# Patient Record
Sex: Male | Born: 1945 | Race: White | Hispanic: No | Marital: Married | Smoking: Never smoker
Health system: Southern US, Community
[De-identification: ages and names within clinical notes are randomized; demographics above are authoritative.]

## PROBLEM LIST (undated history)

## (undated) DIAGNOSIS — I10 Essential (primary) hypertension: Secondary | ICD-10-CM

## (undated) DIAGNOSIS — E349 Endocrine disorder, unspecified: Secondary | ICD-10-CM

## (undated) DIAGNOSIS — E119 Type 2 diabetes mellitus without complications: Secondary | ICD-10-CM

## (undated) DIAGNOSIS — K219 Gastro-esophageal reflux disease without esophagitis: Secondary | ICD-10-CM

## (undated) DIAGNOSIS — J45909 Unspecified asthma, uncomplicated: Secondary | ICD-10-CM

## (undated) DIAGNOSIS — D126 Benign neoplasm of colon, unspecified: Secondary | ICD-10-CM

## (undated) DIAGNOSIS — G4733 Obstructive sleep apnea (adult) (pediatric): Secondary | ICD-10-CM

## (undated) DIAGNOSIS — K2270 Barrett's esophagus without dysplasia: Secondary | ICD-10-CM

## (undated) DIAGNOSIS — E785 Hyperlipidemia, unspecified: Secondary | ICD-10-CM

## (undated) DIAGNOSIS — J302 Other seasonal allergic rhinitis: Secondary | ICD-10-CM

## (undated) HISTORY — PX: COLONOSCOPY: SHX174

## (undated) HISTORY — PX: CATARACT EXTRACTION: SUR2

## (undated) HISTORY — PX: COLONOSCOPY WITH ESOPHAGOGASTRODUODENOSCOPY (EGD): SHX5779

## (undated) HISTORY — DX: Gastro-esophageal reflux disease without esophagitis: K21.9

## (undated) HISTORY — PX: YAG LASER APPLICATION: SHX6189

---

## 2000-03-13 HISTORY — PX: SEPTOPLASTY: SUR1290

## 2000-03-13 HISTORY — PX: FLEXIBLE SIGMOIDOSCOPY: SHX1649

## 2001-07-01 HISTORY — PX: CATARACT EXTRACTION, BILATERAL: SHX1313

## 2004-07-01 ENCOUNTER — Ambulatory Visit: Payer: Self-pay

## 2006-08-08 ENCOUNTER — Ambulatory Visit: Payer: Self-pay

## 2006-12-31 ENCOUNTER — Emergency Department: Payer: Self-pay | Admitting: Emergency Medicine

## 2007-08-20 ENCOUNTER — Ambulatory Visit: Payer: Self-pay | Admitting: Unknown Physician Specialty

## 2008-01-10 IMAGING — CR DG CHEST 2V
1 series · 2 of 2 positions shown · non-contrast
Comparison: none

REASON FOR EXAM: Chest shortness of breath,, cough,  pneumonia
COMMENTS:

[Series 1: view not recorded · 0.17mm/px · 2 of 2 slices shown]
[im 1/2]
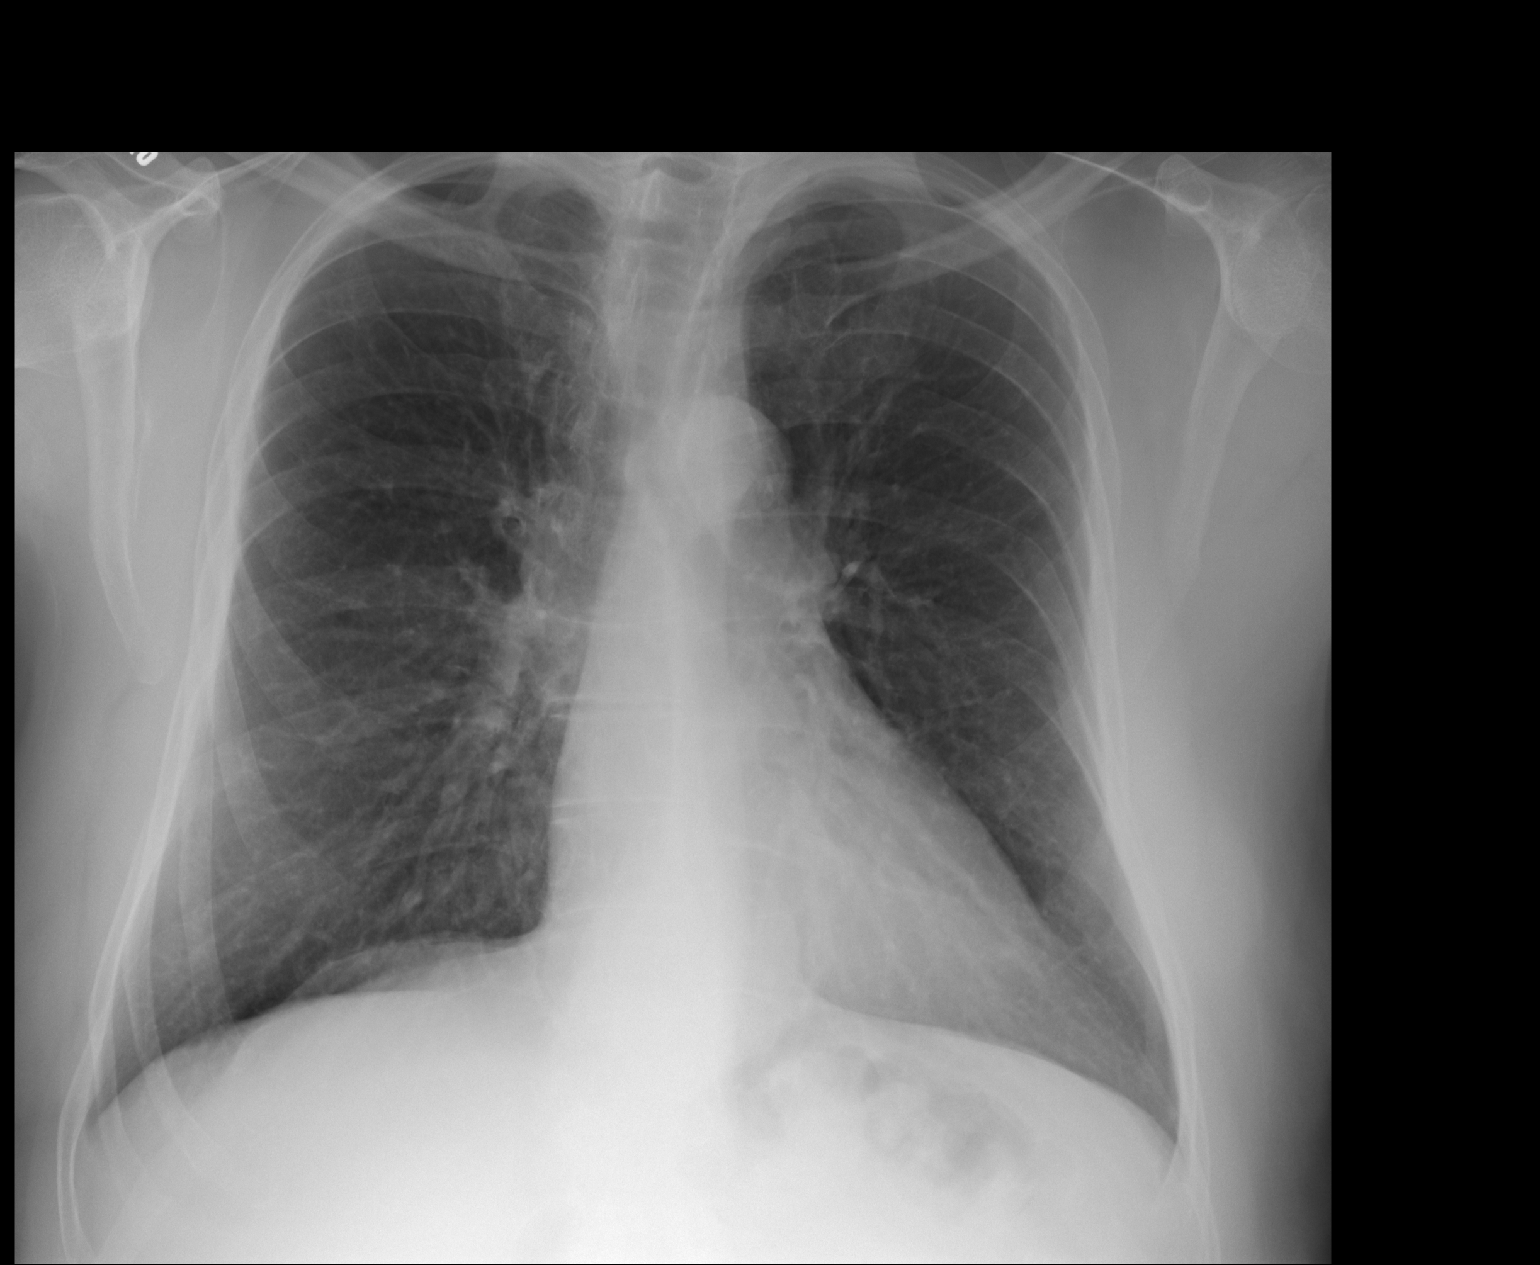
[im 2/2]
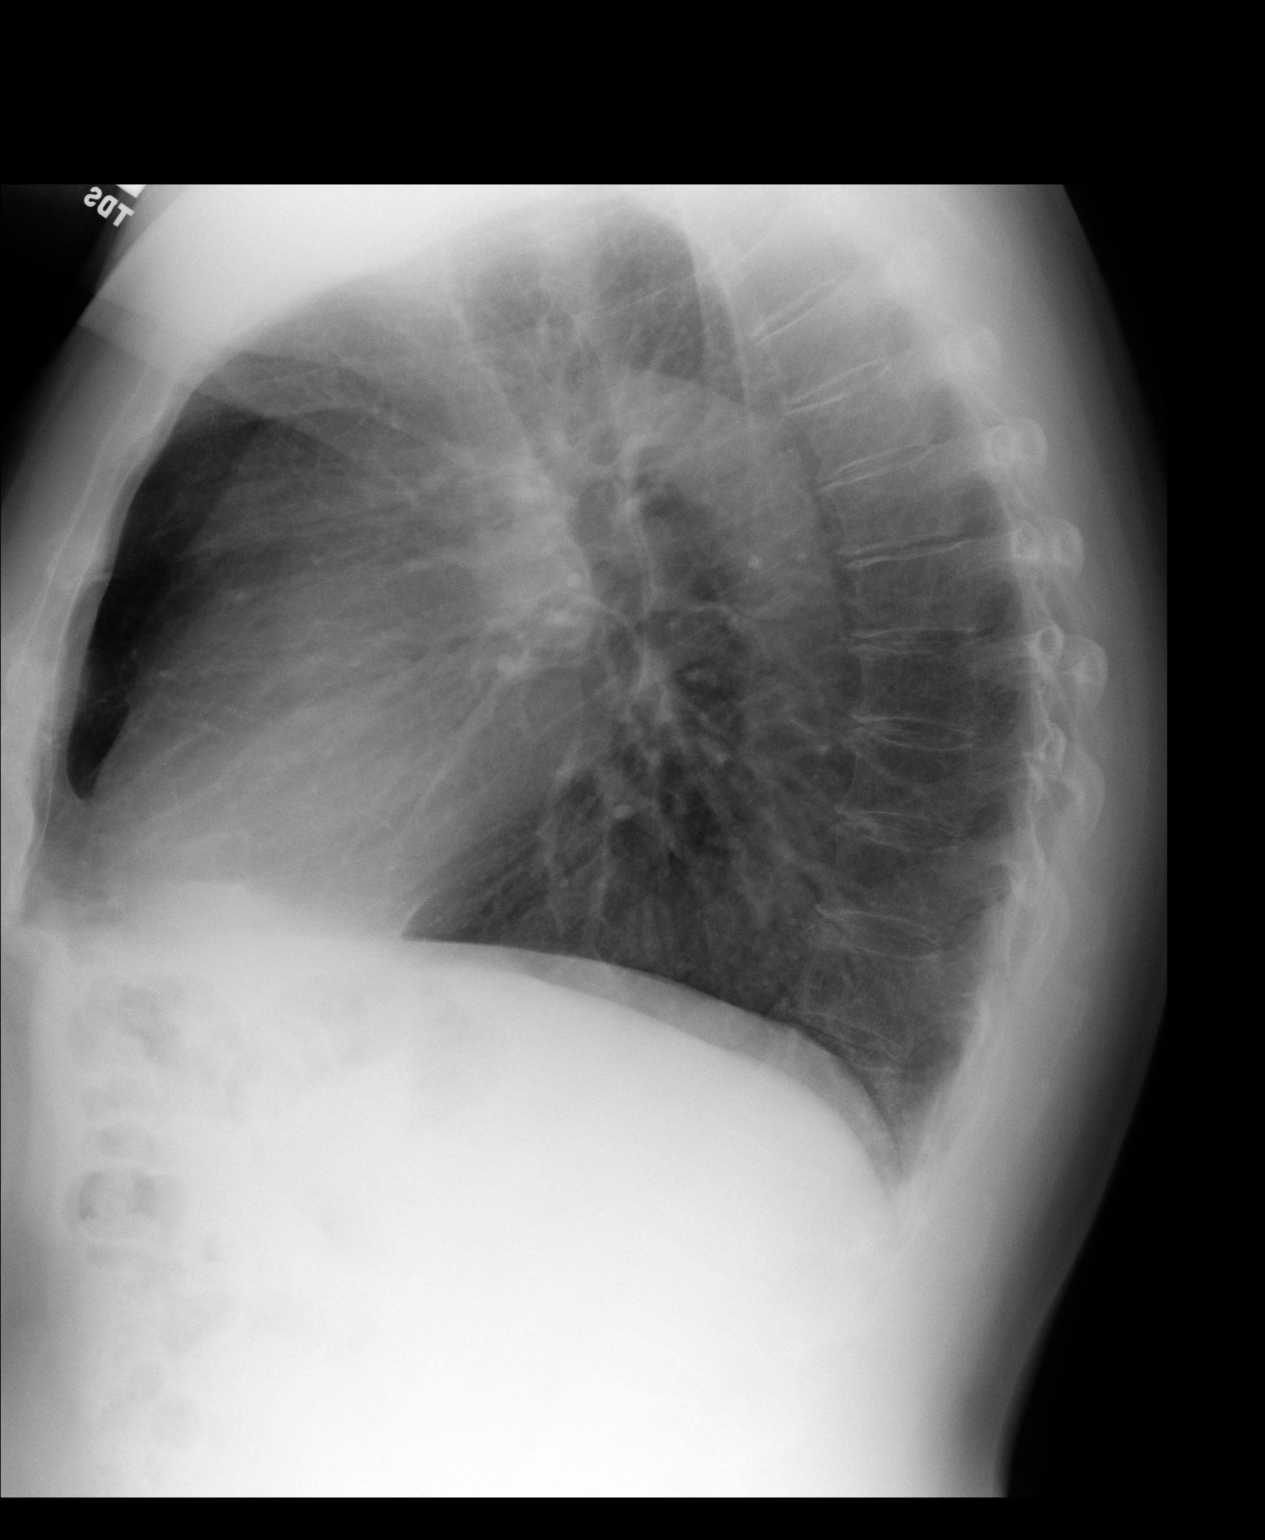

[2 of 2 positions shown; findings below may reference images not displayed]

PROCEDURE:     DXR - DXR CHEST PA (OR AP) AND LATERAL  - August 08, 2006  [DATE]

RESULT:     The lung fields are clear.  No pneumonia, pneumothorax, or
pleural effusion is seen.  Chest appears hyperexpanded, suspicious for
history of COPD or asthma.  There is a slight lumbar scoliosis with
convexity to the RIGHT.
IMPRESSION: The lung fields are clear.

The chest appears mildly hyperexpanded.

## 2008-06-03 IMAGING — CT CT CHEST W/ CM
2 series · 16 of 32 positions shown, 20 images · non-contrast
Comparison: none

REASON FOR EXAM: Risk factors for PE just returned from [place], Fever,
Chest Pain
COMMENTS:

[Series 4: soft tissue · axial · 0.74mm/px · z∈[-632,-584]mm · 2 of 107 slices shown]
[im 9/107  mediastinal]
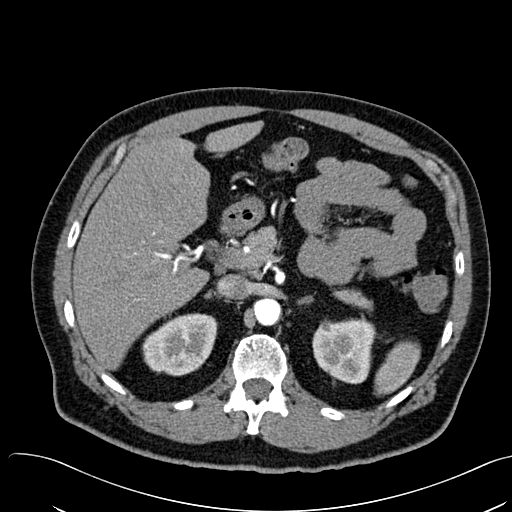
[im 25/107  mediastinal]
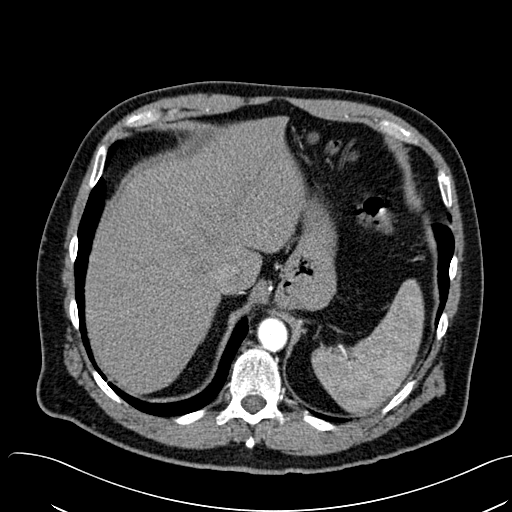

[Series 5: lung windows · axial · 0.74mm/px · z∈[-629,-365]mm · 14 of 106 slices shown, 18 images]
[im 9/106  mediastinal]
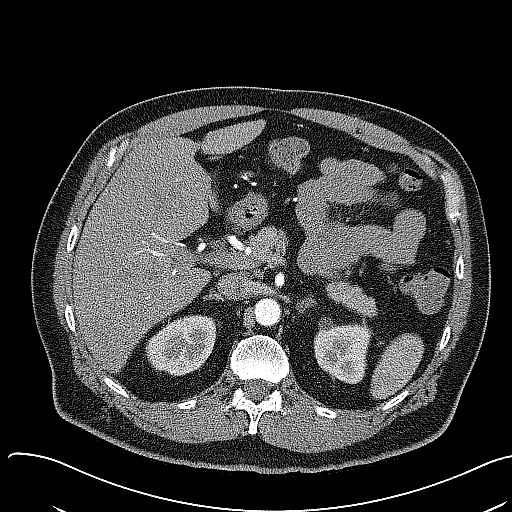
[im 9/106  lung]
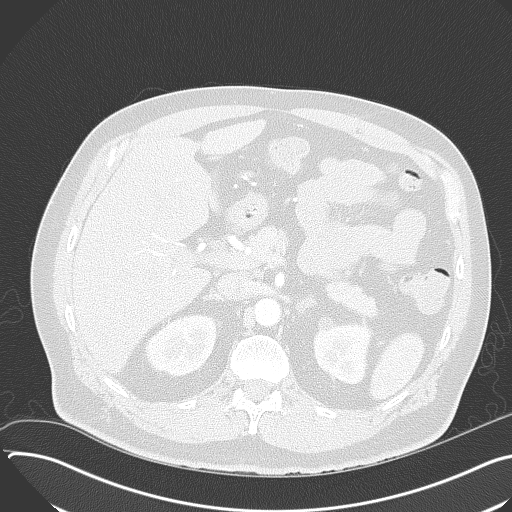
[im 17/106  lung]
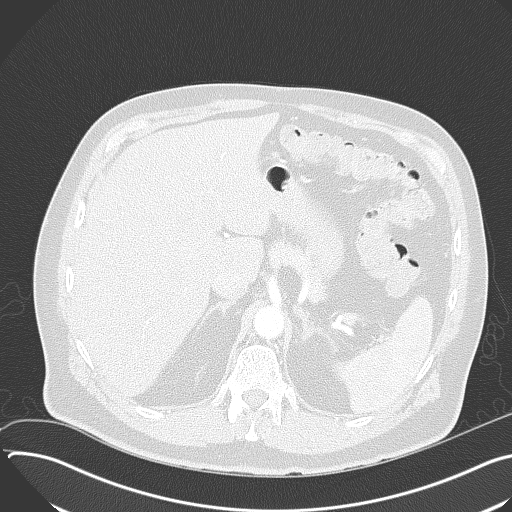
[im 25/106  lung]
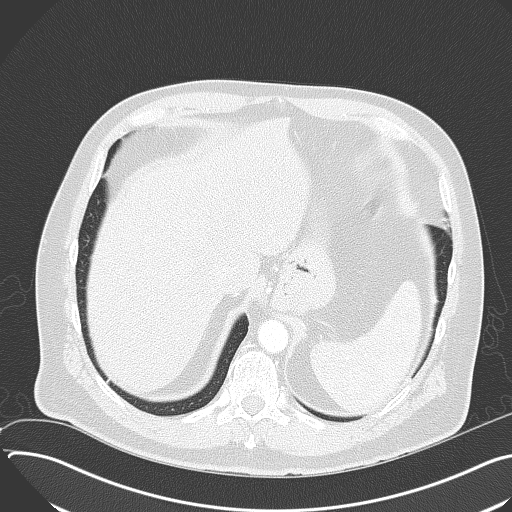
[im 33/106  lung]
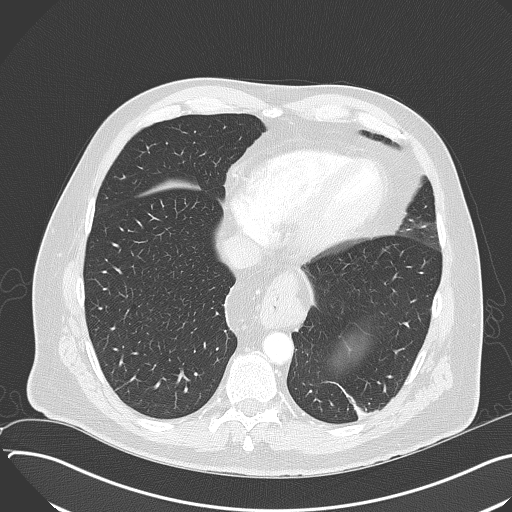
[im 41/106  mediastinal]
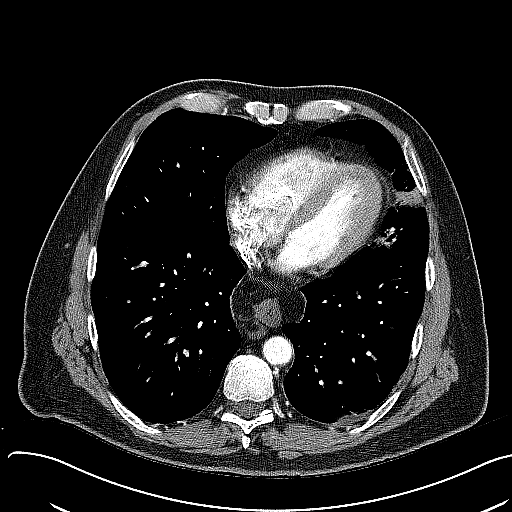
[im 41/106  lung]
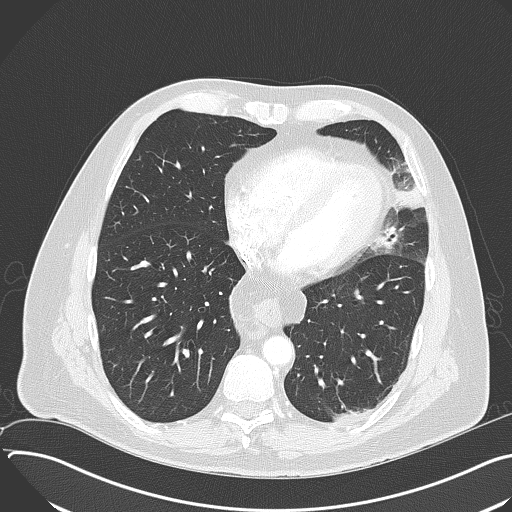
[im 49/106  lung]
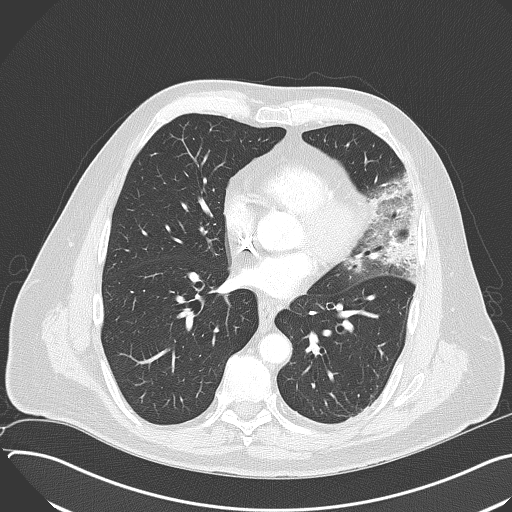
[im 50/106  lung]
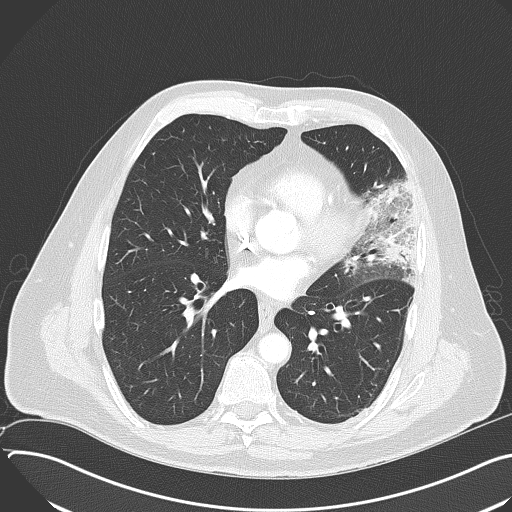
[im 53/106  lung]
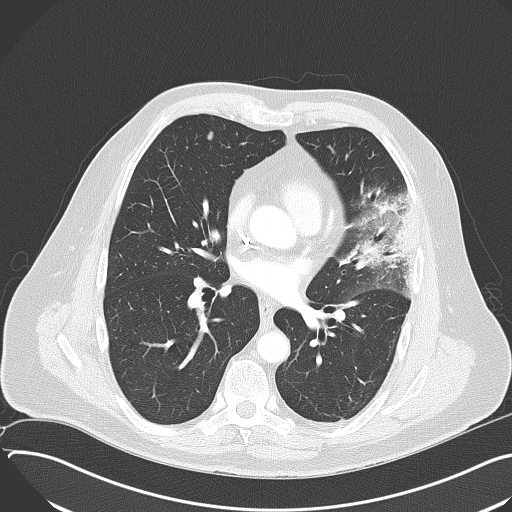
[im 57/106  mediastinal]
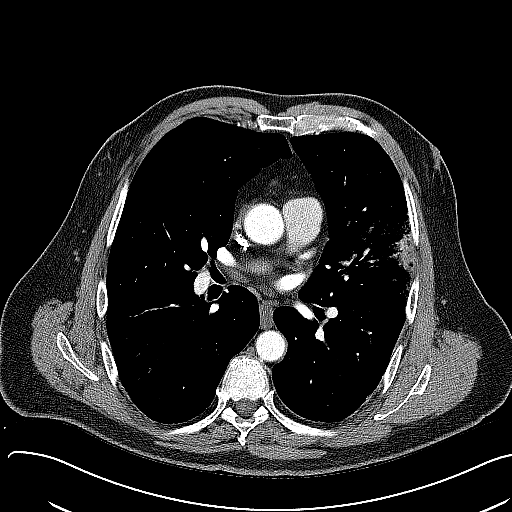
[im 57/106  lung]
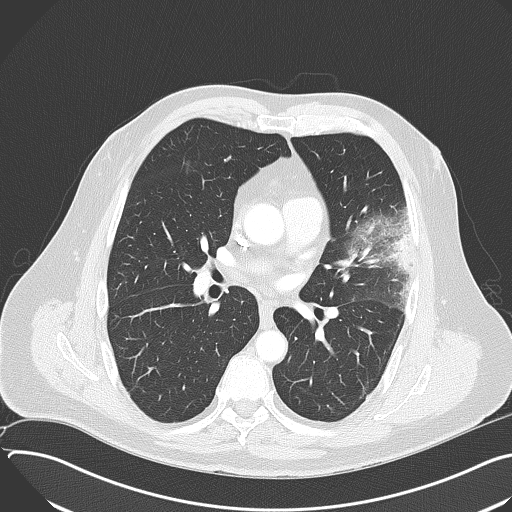
[im 65/106  lung]
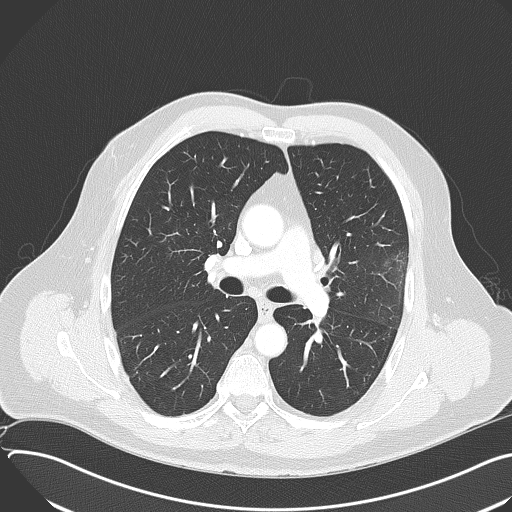
[im 73/106  lung]
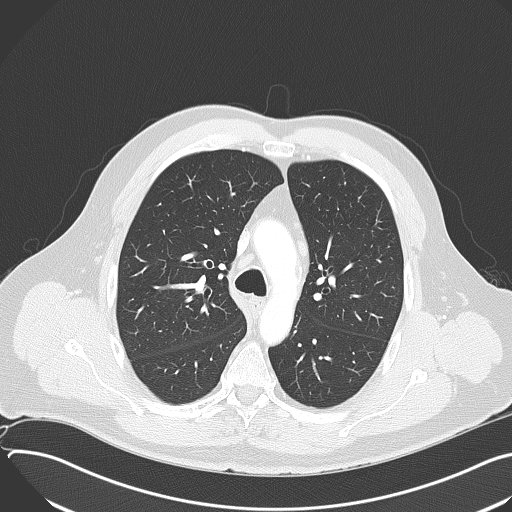
[im 81/106  lung]
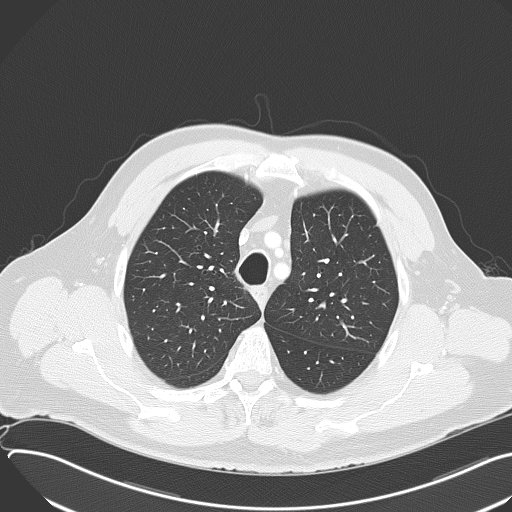
[im 89/106  mediastinal]
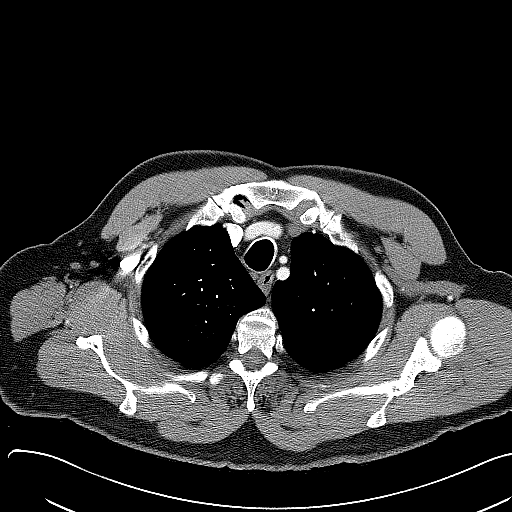
[im 89/106  lung]
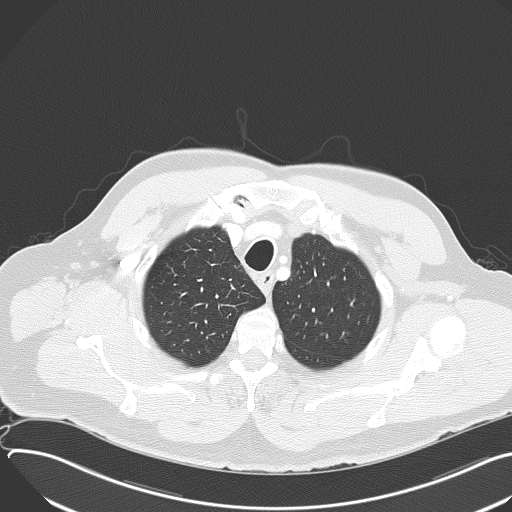
[im 97/106  lung]
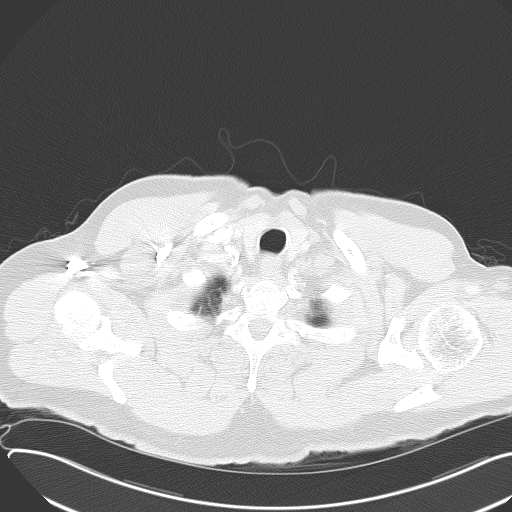

[16 of 32 positions shown; findings below may reference images not displayed]

PROCEDURE:     CT  - CT CHEST (FOR PE) W  - December 31, 2006  [DATE]

RESULT:     Emergent CT of the chest demonstrates plaque along the left
lateral aspect of the aortic arch possibly with some ulceration. This area
of plaque is relatively small. There is no dissection. There is no aneurysm.
There is good opacification of the pulmonary arteries with no evidence of
pulmonary embolism.

There is infiltrate in the lingula. There is no pleural or pericardial
effusion. There is a hiatal hernia. The upper abdominal viscera appear to be
normal. There is no adenopathy evident in the axillary regions. Some
paraaortic enlarged lymph nodes the length of 2.7 centimeters are present.
IMPRESSION: 1. No pulmonary embolism.
2. Lingular pneumonia.
3. Hiatal hernia. .

## 2010-12-13 ENCOUNTER — Ambulatory Visit: Payer: Self-pay | Admitting: Unknown Physician Specialty

## 2012-09-07 ENCOUNTER — Ambulatory Visit: Payer: Self-pay | Admitting: Unknown Physician Specialty

## 2014-01-15 ENCOUNTER — Ambulatory Visit: Payer: Self-pay | Admitting: Unknown Physician Specialty

## 2014-01-16 LAB — PATHOLOGY REPORT

## 2014-06-19 ENCOUNTER — Ambulatory Visit: Payer: Self-pay | Admitting: Unknown Physician Specialty

## 2015-11-21 IMAGING — CR DG CHEST 2V
1 series · 2 of 2 positions shown · non-contrast
Comparison: CT chest 12/31/2006

CLINICAL DATA: Productive cough for 1 week, cough, yellow mucus

EXAM:
CHEST  2 VIEW

[Series 1: kdxr chest pa (or ap) and lat · 0.14mm/px · 2 of 2 slices shown]
[im 1/2]
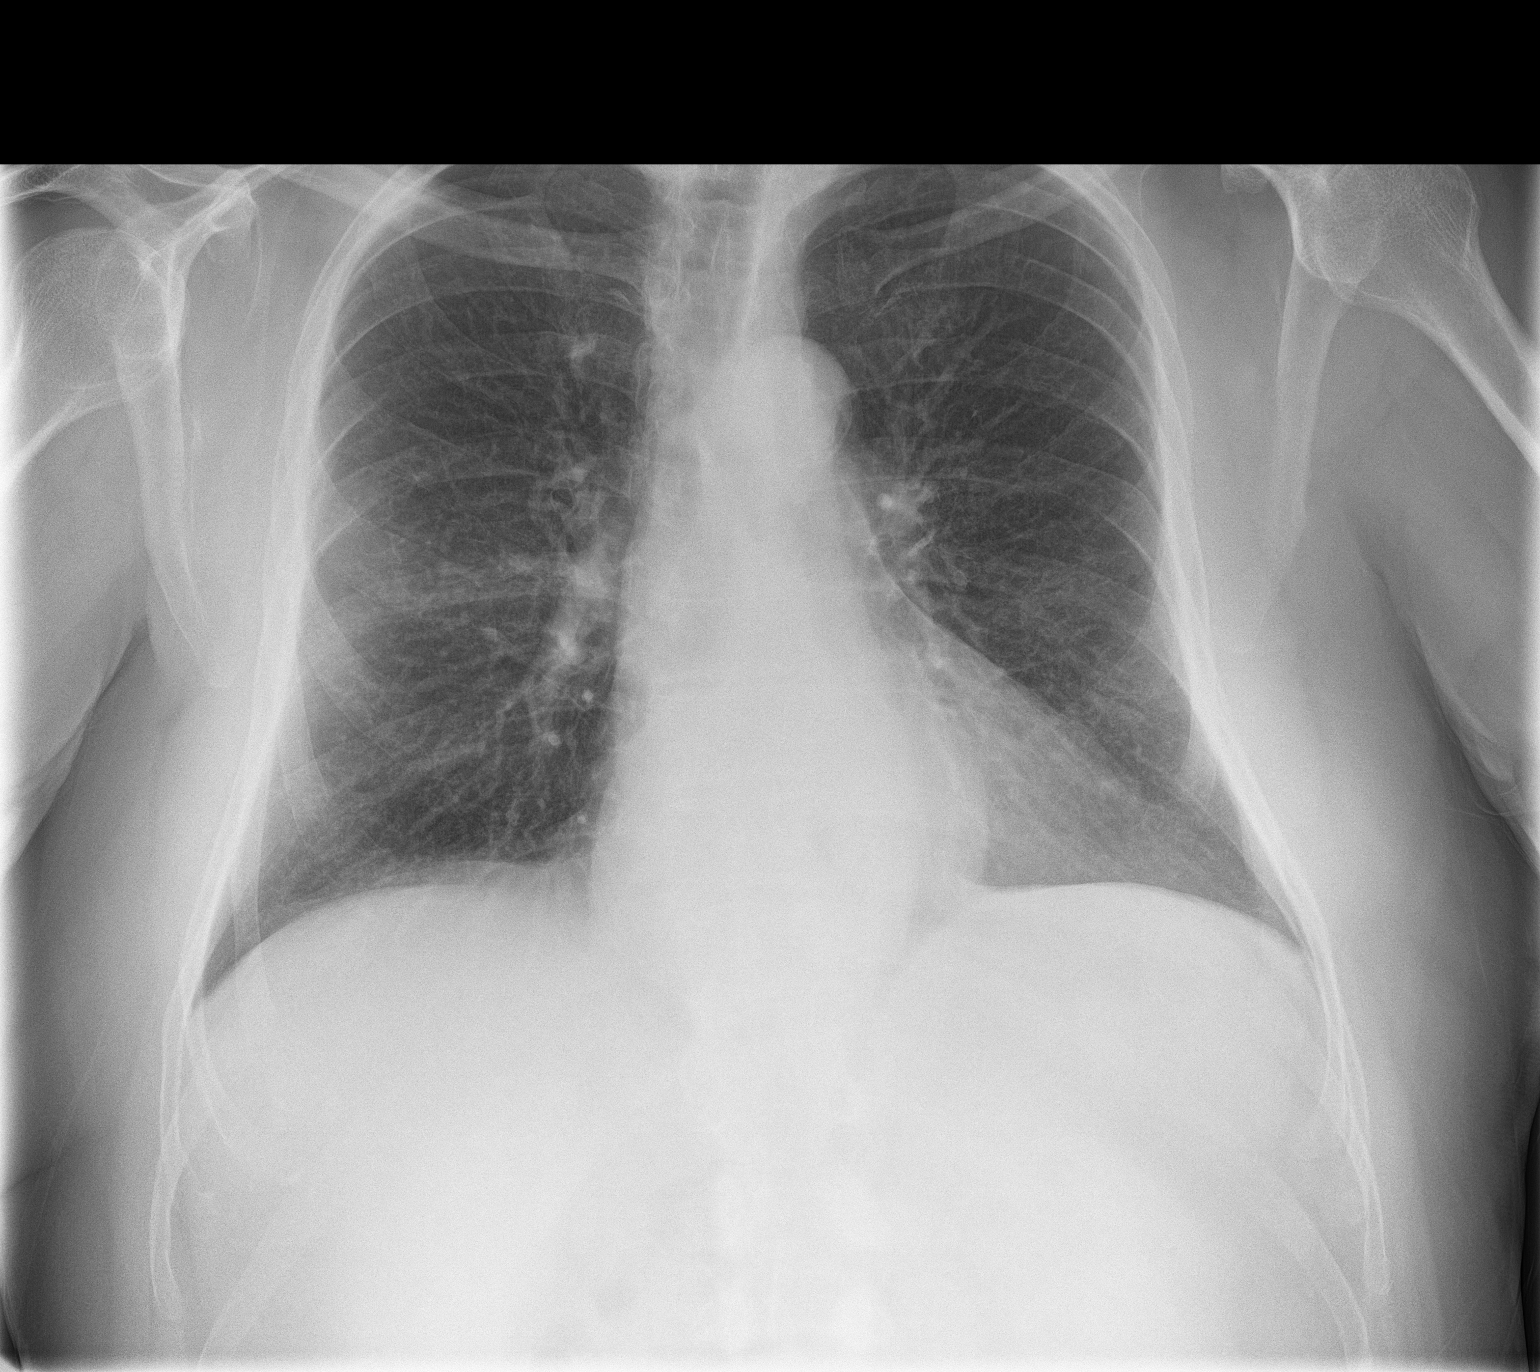
[im 2/2]
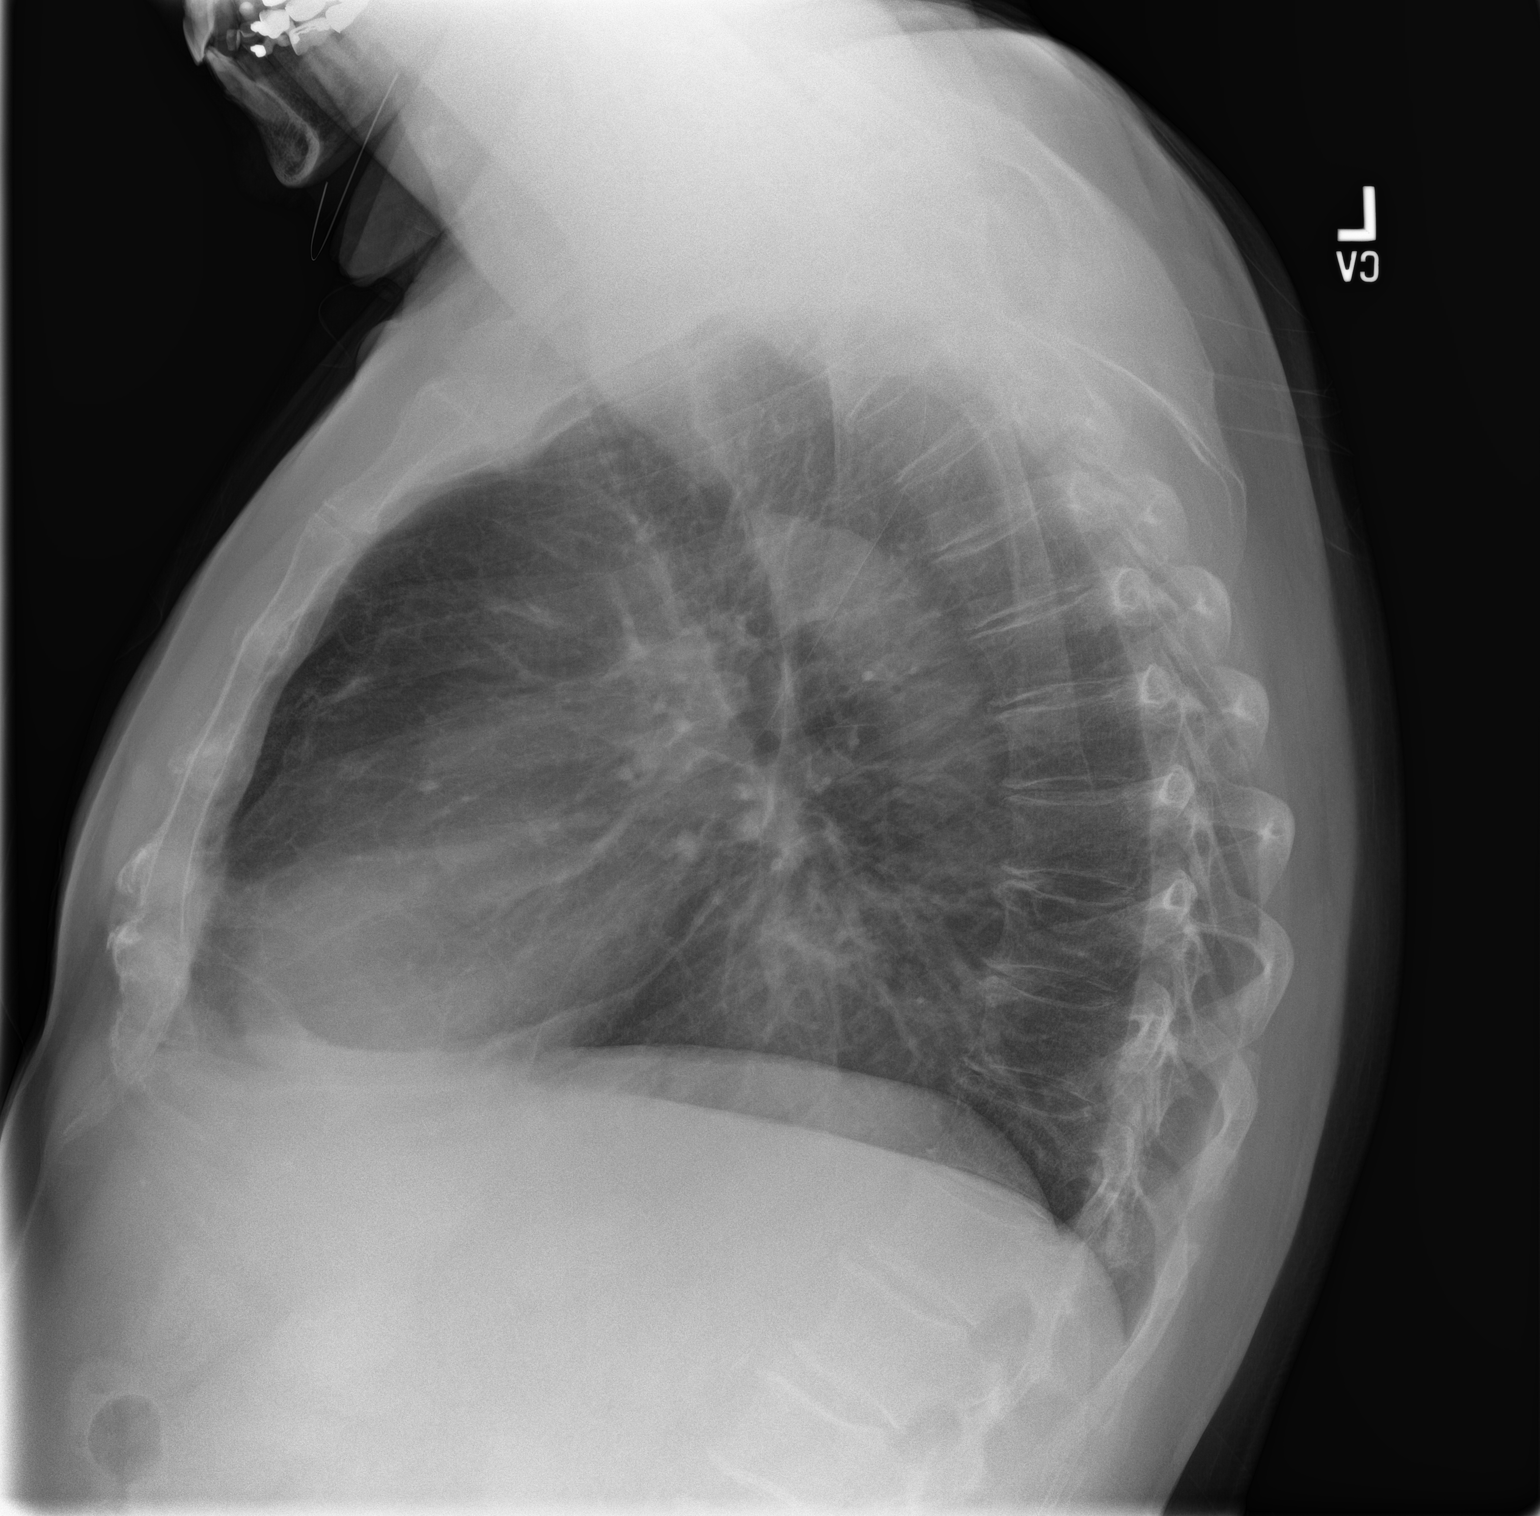

[2 of 2 positions shown; findings below may reference images not displayed]

FINDINGS: The heart size and mediastinal contours are within normal limits.
Both lungs are clear. The visualized skeletal structures are
unremarkable.
IMPRESSION: No active cardiopulmonary disease.

## 2016-12-27 ENCOUNTER — Encounter: Payer: Self-pay | Admitting: *Deleted

## 2016-12-28 ENCOUNTER — Ambulatory Visit: Payer: Medicare HMO | Admitting: Anesthesiology

## 2016-12-28 ENCOUNTER — Encounter
Admission: RE | Disposition: A | Discharge status: Home / Self Care | Payer: Self-pay | Source: Ambulatory Visit | Attending: Unknown Physician Specialty

## 2016-12-28 ENCOUNTER — Ambulatory Visit
Admission: RE | Admit: 2016-12-28 | Discharge: 2016-12-28 | Disposition: A | Discharge status: Home / Self Care | Payer: Medicare HMO | Source: Ambulatory Visit | Attending: Unknown Physician Specialty | Admitting: Unknown Physician Specialty

## 2016-12-28 DIAGNOSIS — K2270 Barrett's esophagus without dysplasia: Secondary | ICD-10-CM | POA: Insufficient documentation

## 2016-12-28 DIAGNOSIS — I10 Essential (primary) hypertension: Secondary | ICD-10-CM | POA: Diagnosis not present

## 2016-12-28 DIAGNOSIS — E119 Type 2 diabetes mellitus without complications: Secondary | ICD-10-CM | POA: Diagnosis not present

## 2016-12-28 DIAGNOSIS — Z881 Allergy status to other antibiotic agents status: Secondary | ICD-10-CM | POA: Insufficient documentation

## 2016-12-28 DIAGNOSIS — Z79899 Other long term (current) drug therapy: Secondary | ICD-10-CM | POA: Diagnosis not present

## 2016-12-28 DIAGNOSIS — K219 Gastro-esophageal reflux disease without esophagitis: Secondary | ICD-10-CM | POA: Diagnosis not present

## 2016-12-28 DIAGNOSIS — Z7984 Long term (current) use of oral hypoglycemic drugs: Secondary | ICD-10-CM | POA: Insufficient documentation

## 2016-12-28 DIAGNOSIS — K449 Diaphragmatic hernia without obstruction or gangrene: Secondary | ICD-10-CM | POA: Diagnosis not present

## 2016-12-28 DIAGNOSIS — Z885 Allergy status to narcotic agent status: Secondary | ICD-10-CM | POA: Insufficient documentation

## 2016-12-28 DIAGNOSIS — J45909 Unspecified asthma, uncomplicated: Secondary | ICD-10-CM | POA: Diagnosis not present

## 2016-12-28 DIAGNOSIS — G4733 Obstructive sleep apnea (adult) (pediatric): Secondary | ICD-10-CM | POA: Diagnosis not present

## 2016-12-28 DIAGNOSIS — E785 Hyperlipidemia, unspecified: Secondary | ICD-10-CM | POA: Diagnosis not present

## 2016-12-28 DIAGNOSIS — E291 Testicular hypofunction: Secondary | ICD-10-CM | POA: Diagnosis not present

## 2016-12-28 HISTORY — DX: Essential (primary) hypertension: I10

## 2016-12-28 HISTORY — DX: Other seasonal allergic rhinitis: J30.2

## 2016-12-28 HISTORY — DX: Barrett's esophagus without dysplasia: K22.70

## 2016-12-28 HISTORY — DX: Unspecified asthma, uncomplicated: J45.909

## 2016-12-28 HISTORY — DX: Endocrine disorder, unspecified: E34.9

## 2016-12-28 HISTORY — DX: Type 2 diabetes mellitus without complications: E11.9

## 2016-12-28 HISTORY — DX: Gastro-esophageal reflux disease without esophagitis: K21.9

## 2016-12-28 HISTORY — PX: ESOPHAGOGASTRODUODENOSCOPY (EGD) WITH PROPOFOL: SHX5813

## 2016-12-28 HISTORY — DX: Obstructive sleep apnea (adult) (pediatric): G47.33

## 2016-12-28 HISTORY — DX: Hyperlipidemia, unspecified: E78.5

## 2016-12-28 LAB — GLUCOSE, CAPILLARY: Glucose-Capillary: 117 mg/dL — ABNORMAL HIGH (ref 65–99)

## 2016-12-28 SURGERY — ESOPHAGOGASTRODUODENOSCOPY (EGD) WITH PROPOFOL
Anesthesia: General

## 2016-12-28 MED ORDER — MIDAZOLAM HCL 2 MG/2ML IJ SOLN
INTRAMUSCULAR | Status: AC
  Filled 2016-12-28: qty 2

## 2016-12-28 MED ORDER — FENTANYL CITRATE (PF) 100 MCG/2ML IJ SOLN
INTRAMUSCULAR | Status: AC
  Filled 2016-12-28: qty 2

## 2016-12-28 MED ORDER — LIDOCAINE HCL 2 % IJ SOLN
INTRAMUSCULAR | Status: AC
  Filled 2016-12-28: qty 10

## 2016-12-28 MED ORDER — GLYCOPYRROLATE 0.2 MG/ML IJ SOLN
INTRAMUSCULAR | Status: DC | PRN
  Administered 2016-12-28: 0.2 mg via INTRAVENOUS

## 2016-12-28 MED ORDER — BUTAMBEN-TETRACAINE-BENZOCAINE 2-2-14 % EX AERO
INHALATION_SPRAY | CUTANEOUS | Status: DC | PRN
  Administered 2016-12-28: 1 via TOPICAL

## 2016-12-28 MED ORDER — GLYCOPYRROLATE 0.2 MG/ML IJ SOLN
INTRAMUSCULAR | Status: AC
  Filled 2016-12-28: qty 1

## 2016-12-28 MED ORDER — PROPOFOL 500 MG/50ML IV EMUL
INTRAVENOUS | Status: DC | PRN
  Administered 2016-12-28: 120 ug/kg/min via INTRAVENOUS

## 2016-12-28 MED ORDER — SODIUM CHLORIDE 0.9 % IV SOLN
INTRAVENOUS | Status: DC
  Administered 2016-12-28: 1000 mL via INTRAVENOUS

## 2016-12-28 MED ORDER — FENTANYL CITRATE (PF) 100 MCG/2ML IJ SOLN
INTRAMUSCULAR | Status: DC | PRN
  Administered 2016-12-28: 50 ug via INTRAVENOUS

## 2016-12-28 MED ORDER — LIDOCAINE HCL (CARDIAC) 20 MG/ML IV SOLN
INTRAVENOUS | Status: DC | PRN
  Administered 2016-12-28: 30 mg via INTRAVENOUS

## 2016-12-28 MED ORDER — MIDAZOLAM HCL 2 MG/2ML IJ SOLN
INTRAMUSCULAR | Status: DC | PRN
  Administered 2016-12-28: 1 mg via INTRAVENOUS

## 2016-12-28 MED ORDER — PROPOFOL 500 MG/50ML IV EMUL
INTRAVENOUS | Status: AC
  Filled 2016-12-28: qty 50

## 2016-12-28 MED ORDER — SODIUM CHLORIDE 0.9 % IV SOLN: INTRAVENOUS | Status: DC

## 2016-12-28 NOTE — Transfer of Care (Signed)
Immediate Anesthesia Transfer of Care Note  Patient: Trevor Stafford.  Procedure(s) Performed: Procedure(s): ESOPHAGOGASTRODUODENOSCOPY (EGD) WITH PROPOFOL (N/A)  Patient Location: PACU  Anesthesia Type:General  Level of Consciousness: awake and sedated  Airway & Oxygen Therapy: Patient Spontanous Breathing and Patient connected to nasal cannula oxygen  Post-op Assessment: Report given to RN and Post -op Vital signs reviewed and stable  Post vital signs: Reviewed and stable  Last Vitals:  Vitals:   12/28/16 0835  BP: (!) 173/90  Pulse: 96  Resp: 20  Temp: 36.5 C    Last Pain:  Vitals:   12/28/16 0835  TempSrc: Tympanic         Complications: No apparent anesthesia complications

## 2016-12-28 NOTE — Anesthesia Procedure Notes (Signed)
Performed by: COOK-MARTIN, Nathyn Luiz Pre-anesthesia Checklist: Patient identified, Suction available, Emergency Drugs available, Patient being monitored and Timeout performed Patient Re-evaluated:Patient Re-evaluated prior to inductionOxygen Delivery Method: Nasal cannula Preoxygenation: Pre-oxygenation with 100% oxygen Intubation Type: IV induction Airway Equipment and Method: Bite block Placement Confirmation: CO2 detector and positive ETCO2       

## 2016-12-28 NOTE — H&P (Signed)
Primary Care Physician:  Katheren Shams Primary Gastroenterologist:  Dr. Vira Agar  Pre-Procedure History & Physical: HPI:  Trevor Murtha. is a 71 y.o. male is here for an endoscopy.   Past Medical History:  Diagnosis Date  . Barrett esophagus   . Diabetes mellitus without complication (HCC)    Type 2  . GERD (gastroesophageal reflux disease)   . Hyperlipidemia   . Hypertension   . Hypotestosteronemia   . OSA (obstructive sleep apnea)   . Reactive airway disease   . Seasonal allergies     Past Surgical History:  Procedure Laterality Date  . CATARACT EXTRACTION, BILATERAL  07/01/2001  . COLONOSCOPY Right 2009/2014  . COLONOSCOPY WITH ESOPHAGOGASTRODUODENOSCOPY (EGD)  2001/2005/2009/2012/2015  . FLEXIBLE SIGMOIDOSCOPY  03/13/2000  . SEPTOPLASTY  03/13/2000    Prior to Admission medications   Medication Sig Start Date End Date Taking? Authorizing Provider  albuterol (PROVENTIL HFA;VENTOLIN HFA) 108 (90 Base) MCG/ACT inhaler Inhale into the lungs every 6 (six) hours as needed for wheezing or shortness of breath.   Yes [provider]  aspirin-acetaminophen-caffeine (EXCEDRIN MIGRAINE) [phone] MG tablet Take by mouth every 6 (six) hours as needed for headache or migraine (PRN).   Yes [provider]  BIOGAIA PROBIOTIC (BIOGAIA PROBIOTIC) LIQD Take by mouth daily at 8 pm.   Yes [provider]  budesonide (PULMICORT) 180 MCG/ACT inhaler Inhale into the lungs 2 (two) times daily.   Yes [provider]  calcium-vitamin D (OSCAL WITH D) 500-200 MG-UNIT tablet Take 1 tablet by mouth.   Yes [provider]  esomeprazole (NEXIUM) 40 MG packet Take 40 mg by mouth daily before breakfast.   Yes [provider]  glimepiride (AMARYL) 1 MG tablet Take 1 mg by mouth daily with breakfast.   Yes [provider]  hydrocortisone (ANUSOL-HC) 2.5 % rectal cream Place 1 application rectally.   Yes [provider]   irbesartan (AVAPRO) 150 MG tablet Take 150 mg by mouth daily.   Yes [provider]  loteprednol (LOTEMAX) 0.5 % ophthalmic suspension 4 (four) times daily.   Yes [provider]  magnesium oxide (MAG-OX) 400 MG tablet Take 400 mg by mouth daily.   Yes [provider]  metFORMIN (GLUCOPHAGE-XR) 500 MG 24 hr tablet Take 500 mg by mouth daily with breakfast.   Yes [provider]  mometasone (NASONEX) 50 MCG/ACT nasal spray Place 2 sprays into the nose daily.   Yes [provider]  sildenafil (REVATIO) 20 MG tablet Take 20 mg by mouth 3 (three) times daily.   Yes [provider]  testosterone cypionate (DEPOTESTOTERONE CYPIONATE) 100 MG/ML injection Inject 200 mg into the muscle every 14 (fourteen) days. For IM use only   Yes [provider]    Allergies as of 12/27/2016 - Review Complete 12/27/2016  Allergen Reaction Noted  . Biaxin [clarithromycin] Nausea Only 12/27/2016  . Codeine sulfate Nausea Only 12/27/2016    No family history on file.  Social History   Social History  . Marital status: Married    Spouse name: N/A  . Number of children: N/A  . Years of education: N/A   Occupational History  . Not on file.   Social History Main Topics  . Smoking status: Never Smoker  . Smokeless tobacco: Not on file  . Alcohol use No  . Drug use: No  . Sexual activity: Not on file   Other Topics Concern  . Not on file   Social  History Narrative  . No narrative on file    Review of Systems: See HPI, otherwise negative ROS  Physical Exam: BP (!) 173/90   Pulse 96   Temp 97.7 F (36.5 C) (Tympanic)   Resp 20   Ht 5\' 8"  (1.727 m)   Wt 87.1 kg (192 lb)   SpO2 97%   BMI 29.19 kg/m  General:   Alert,  pleasant and cooperative in NAD Head:  Normocephalic and atraumatic. Neck:  Supple; no masses or thyromegaly. Lungs:  Clear throughout to auscultation.    Heart:  Regular rate and rhythm. Abdomen:  Soft, nontender  and nondistended. Normal bowel sounds, without guarding, and without rebound.   Neurologic:  Alert and  oriented x4;  grossly normal neurologically.  Impression/Plan: Trevor Don. is here for an endoscopy to be performed for Follow up Barretts esophagus and GERD  Risks, benefits, limitations, and alternatives regarding  endoscopy have been reviewed with the patient.  Questions have been answered.  All parties agreeable.   Gaylyn Cheers, MD  12/28/2016, 8:43 AM

## 2016-12-28 NOTE — Anesthesia Post-op Follow-up Note (Cosign Needed)
Anesthesia QCDR form completed.        

## 2016-12-28 NOTE — Op Note (Signed)
[facility] Gastroenterology Patient Name: Trevor Stafford Procedure Date: 12/28/2016 8:13 AM MRN: [id number] Account #: [id number] Date of Birth: 1946/06/09 Admit Type: Outpatient Age: 71 Room: [facility] ENDO ROOM 3 Gender: Male Note Status: Finalized Procedure:            Upper GI endoscopy Indications:          Follow-up of Barrett's esophagus Providers:            Manya Silvas, MD Referring MD:         Rusty Aus, MD (Referring MD) Medicines:            Propofol per Anesthesia Complications:        No immediate complications. Procedure:            Pre-Anesthesia Assessment:                       - After reviewing the risks and benefits, the patient                        was deemed in satisfactory condition to undergo the                        procedure.                       After obtaining informed consent, the endoscope was                        passed under direct vision. Throughout the procedure,                        the patient's blood pressure, pulse, and oxygen                        saturations were monitored continuously. The Endoscope                        was introduced through the mouth, and advanced to the                        second part of duodenum. The upper GI endoscopy was                        accomplished without difficulty. The patient tolerated                        the procedure well. Findings:      There were esophageal mucosal changes secondary to established       short-segment Barrett's disease present in the distal esophagus. The       maximum longitudinal extent of these mucosal changes was 3 cm in length.       Mucosa was biopsied with a cold forceps for histology. One specimen       bottle was sent to pathology.      A large hiatal hernia was present.      The gastric antrum was normal.      The examined duodenum was normal. Impression:           - Esophageal mucosal changes secondary to established              short-segment  Barrett's disease. Biopsied.                       - Large hiatal hernia.                       - Normal antrum.                       - Normal examined duodenum. Recommendation:       - Await pathology results. Manya Silvas, MD 12/28/2016 9:07:38 AM This report has been signed electronically. Number of Addenda: 0 Note Initiated On: 12/28/2016 8:13 AM      [facility]

## 2016-12-28 NOTE — Anesthesia Preprocedure Evaluation (Signed)
Anesthesia Evaluation  Patient identified by MRN, date of birth, ID band Patient awake    Reviewed: Allergy & Precautions, H&P , NPO status , Patient's Chart, lab work & pertinent test results, reviewed documented beta blocker date and time   Airway Mallampati: II   Neck ROM: full    Dental  (+) Poor Dentition   Pulmonary neg pulmonary ROS, neg shortness of breath, asthma , sleep apnea ,    Pulmonary exam normal        Cardiovascular hypertension, On Medications negative cardio ROS Normal cardiovascular exam Rhythm:regular Rate:Normal     Neuro/Psych negative neurological ROS  negative psych ROS   GI/Hepatic negative GI ROS, Neg liver ROS, GERD  Medicated,  Endo/Other  negative endocrine ROSdiabetes, Well Controlled, Type 2, Oral Hypoglycemic Agents  Renal/GU negative Renal ROS  negative genitourinary   Musculoskeletal   Abdominal   Peds  Hematology negative hematology ROS (+)   Anesthesia Other Findings Past Medical History: No date: Barrett esophagus No date: Diabetes mellitus without complication (HCC)     Comment: Type 2 No date: GERD (gastroesophageal reflux disease) No date: Hyperlipidemia No date: Hypertension No date: Hypotestosteronemia No date: OSA (obstructive sleep apnea) No date: Reactive airway disease No date: Seasonal allergies Past Surgical History: 07/01/2001: CATARACT EXTRACTION, BILATERAL 2009/2014: COLONOSCOPY Right 2001/2005/2009/2012/2015: COLONOSCOPY WITH ESOPHAGOGASTRODUODENOSCOPY (E* 03/13/2000: FLEXIBLE SIGMOIDOSCOPY 03/13/2000: SEPTOPLASTY BMI    Body Mass Index:  29.19 kg/m     Reproductive/Obstetrics negative OB ROS                             Anesthesia Physical Anesthesia Plan  ASA: III  Anesthesia Plan: General   Post-op Pain Management:    Induction:   Airway Management Planned:   Additional Equipment:   Intra-op Plan:    Post-operative Plan:   Informed Consent: I have reviewed the patients History and Physical, chart, labs and discussed the procedure including the risks, benefits and alternatives for the proposed anesthesia with the patient or authorized representative who has indicated his/her understanding and acceptance.   Dental Advisory Given  Plan Discussed with: CRNA  Anesthesia Plan Comments:         Anesthesia Quick Evaluation

## 2016-12-29 ENCOUNTER — Encounter: Payer: Self-pay | Admitting: Unknown Physician Specialty

## 2016-12-29 LAB — SURGICAL PATHOLOGY

## 2017-01-02 NOTE — Anesthesia Postprocedure Evaluation (Signed)
Anesthesia Post Note  Patient: Trevor Stafford.  Procedure(s) Performed: Procedure(s) (LRB): ESOPHAGOGASTRODUODENOSCOPY (EGD) WITH PROPOFOL (N/A)  Patient location during evaluation: PACU Anesthesia Type: General Level of consciousness: awake and alert Pain management: pain level controlled Vital Signs Assessment: post-procedure vital signs reviewed and stable Respiratory status: spontaneous breathing, nonlabored ventilation, respiratory function stable and patient connected to nasal cannula oxygen Cardiovascular status: blood pressure returned to baseline and stable Postop Assessment: no signs of nausea or vomiting Anesthetic complications: no     Last Vitals:  Vitals:   12/28/16 0930 12/28/16 0940  BP: 102/67 105/67  Pulse: 89 91  Resp: 19 (!) 27  Temp:      Last Pain:  Vitals:   12/29/16 0744  TempSrc:   PainSc: 0-No pain                 Molli Barrows

## 2018-03-06 ENCOUNTER — Encounter (INDEPENDENT_AMBULATORY_CARE_PROVIDER_SITE_OTHER): Payer: Self-pay | Admitting: Ophthalmology

## 2018-03-06 ENCOUNTER — Ambulatory Visit (INDEPENDENT_AMBULATORY_CARE_PROVIDER_SITE_OTHER): Payer: Medicare HMO | Admitting: Ophthalmology

## 2018-03-06 DIAGNOSIS — H43813 Vitreous degeneration, bilateral: Secondary | ICD-10-CM

## 2018-03-06 DIAGNOSIS — H35351 Cystoid macular degeneration, right eye: Secondary | ICD-10-CM

## 2018-03-06 DIAGNOSIS — I10 Essential (primary) hypertension: Secondary | ICD-10-CM | POA: Diagnosis not present

## 2018-03-06 DIAGNOSIS — H3581 Retinal edema: Secondary | ICD-10-CM

## 2018-03-06 DIAGNOSIS — Z961 Presence of intraocular lens: Secondary | ICD-10-CM

## 2018-03-06 DIAGNOSIS — H35033 Hypertensive retinopathy, bilateral: Secondary | ICD-10-CM | POA: Diagnosis not present

## 2018-03-06 MED ORDER — PREDNISOLONE ACETATE 1 % OP SUSP: 1.0000 [drp] | Freq: Four times a day (QID) | OPHTHALMIC | 0 refills | Status: AC

## 2018-03-06 MED ORDER — KETOROLAC TROMETHAMINE 0.5 % OP SOLN: 1.0000 [drp] | Freq: Four times a day (QID) | OPHTHALMIC | 2 refills | Status: AC

## 2018-03-06 NOTE — Progress Notes (Signed)
[facility] Note  03/06/2018     CHIEF COMPLAINT Patient presents for Retina Evaluation   HISTORY OF PRESENT ILLNESS: Trevor Stafford. is a 72 y.o. male who presents to the clinic today for:   HPI    Retina Evaluation    In right eye.  Duration of 2 hours.  Associated Symptoms Negative for Flashes, Blind Spot, Photophobia, Scalp Tenderness, Fever, Weight Loss, Jaw Claudication, Glare, Pain, Floaters, Fatigue, Shoulder/Hip pain, Trauma, Redness and Distortion.  Context:  distance vision, mid-range vision and near vision.  Treatments tried include no treatments.  Response to treatment was no improvement.  I, the attending physician,  performed the HPI with the patient and updated documentation appropriately.          Comments    Pt presents on the referral of Dr. Marvel Plan for post YAG mac swelling OD, pt states he saw Dr. Marvel Plan this morning and was sent straight over here, pt states his vision has been getting blurry over the past few weeks and he thought it was bc of the gtts he was given for IOP, pt states presssure was 12 OU this AM, pt denies any FOL, pain, floaters and wavy vision, pt is using travatan gtts, pt is diabetic and takes glimepiride        Last edited by Bernarda Caffey, MD on 03/06/2018  1:56 PM. (History)    Pt states he had a Yag laser OD by Dr. Nancy Fetter 1-2 months ago; Pt states 2 weeks ago he began to notice decreased VA; Pt states he has been very stressed recently; Pt states he had cataract sx years ago with Dr. Dolores Lory;   Referring physician: Raul, [place] [address] [place], [place] [address]  HISTORICAL INFORMATION:   Selected notes from the MEDICAL RECORD NUMBER Referred by Dr. Marvel Plan for concern of post Yag macular swelling OD;  LEE-  Ocular Hx-  PMH- DM (on metformin), HTN, sleep apnea     CURRENT MEDICATIONS: Current Outpatient Medications (Ophthalmic Drugs)  Medication Sig  . ketorolac (ACULAR) 0.5 %  ophthalmic solution Place 1 drop into the right eye 4 (four) times daily.  Marland Kitchen loteprednol (LOTEMAX) 0.5 % ophthalmic suspension 4 (four) times daily.  . prednisoLONE acetate (PRED FORTE) 1 % ophthalmic suspension Place 1 drop into the right eye 4 (four) times daily.   No current facility-administered medications for this visit.  (Ophthalmic Drugs)   Current Outpatient Medications (Other)  Medication Sig  . albuterol (PROVENTIL HFA;VENTOLIN HFA) 108 (90 Base) MCG/ACT inhaler Inhale into the lungs every 6 (six) hours as needed for wheezing or shortness of breath.  Marland Kitchen aspirin-acetaminophen-caffeine (EXCEDRIN MIGRAINE) 250-250-65 MG tablet Take by mouth every 6 (six) hours as needed for headache or migraine (PRN).  Marland Kitchen BIOGAIA PROBIOTIC (BIOGAIA PROBIOTIC) LIQD Take by mouth daily at 8 pm.  . budesonide (PULMICORT) 180 MCG/ACT inhaler Inhale into the lungs 2 (two) times daily.  . calcium-vitamin D (OSCAL WITH D) 500-200 MG-UNIT tablet Take 1 tablet by mouth.  . esomeprazole (NEXIUM) 40 MG packet Take 40 mg by mouth daily before breakfast.  . glimepiride (AMARYL) 1 MG tablet Take 1 mg by mouth daily with breakfast.  . hydrocortisone (ANUSOL-HC) 2.5 % rectal cream Place 1 application rectally.  . irbesartan (AVAPRO) 150 MG tablet Take 150 mg by mouth daily.  . magnesium oxide (MAG-OX) 400 MG tablet Take 400 mg by mouth daily.  . metFORMIN (GLUCOPHAGE-XR) 500 MG 24 hr tablet Take 500  mg by mouth daily with breakfast.  . mometasone (NASONEX) 50 MCG/ACT nasal spray Place 2 sprays into the nose daily.  Marland Kitchen neomycin-polymyxin-hydrocortisone (CORTISPORIN) 3.5-10000-1 OTIC suspension INSTILL 4 DROPS INTO THE AFFECTED EAR TWICE DAILY FOR 7 DAYS  . sildenafil (REVATIO) 20 MG tablet Take 20 mg by mouth 3 (three) times daily.  Marland Kitchen testosterone cypionate (DEPOTESTOSTERONE CYPIONATE) 200 MG/ML injection INJECT 1.5 MLS (300 MG TOTAL) INTO THE MUSCLE EVERY 28 (TWENTY-EIGHT) DAYS  . testosterone cypionate (DEPOTESTOTERONE  CYPIONATE) 100 MG/ML injection Inject 200 mg into the muscle every 14 (fourteen) days. For IM use only   No current facility-administered medications for this visit.  (Other)      REVIEW OF SYSTEMS: ROS    Positive for: Musculoskeletal, Endocrine, Cardiovascular, Eyes   Negative for: Constitutional, Gastrointestinal, Neurological, Skin, Genitourinary, HENT, Respiratory, Psychiatric, Allergic/Imm, Heme/Lymph   Last edited by Debbrah Alar, COT on 03/06/2018  1:12 PM. (History)       ALLERGIES Allergies  Allergen Reactions  . Biaxin [Clarithromycin] Nausea Only  . Codeine Sulfate Nausea Only    PAST MEDICAL HISTORY Past Medical History:  Diagnosis Date  . Barrett esophagus   . Diabetes mellitus without complication (HCC)    Type 2  . GERD (gastroesophageal reflux disease)   . Hyperlipidemia   . Hypertension   . Hypotestosteronemia   . OSA (obstructive sleep apnea)   . Reactive airway disease   . Seasonal allergies    Past Surgical History:  Procedure Laterality Date  . CATARACT EXTRACTION    . CATARACT EXTRACTION, BILATERAL  07/01/2001  . COLONOSCOPY Right 2009/2014  . COLONOSCOPY WITH ESOPHAGOGASTRODUODENOSCOPY (EGD)  2001/2005/2009/2012/2015  . ESOPHAGOGASTRODUODENOSCOPY (EGD) WITH PROPOFOL N/A 12/28/2016   Procedure: ESOPHAGOGASTRODUODENOSCOPY (EGD) WITH PROPOFOL;  Surgeon: Manya Silvas, MD;  Location: [facility] END[facility]COPY;  Service: Endoscopy;  Laterality: N/A;  . FLEXIBLE SIGMOIDOSCOPY  03/13/2000  . SEPTOPLASTY  03/13/2000  . YAG LASER APPLICATION      FAMILY HISTORY Family History  Problem Relation Age of Onset  . Cataracts Mother   . Amblyopia Neg Hx   . Blindness Neg Hx   . Glaucoma Neg Hx   . Macular degeneration Neg Hx   . Retinal detachment Neg Hx   . Strabismus Neg Hx   . Retinitis pigmentosa Neg Hx     SOCIAL HISTORY Social History   Tobacco Use  . Smoking status: Never Smoker  . Smokeless tobacco: Never Used  Substance Use Topics  .  Alcohol use: No  . Drug use: No         OPHTHALMIC EXAM:  Base Eye Exam    Visual Acuity (Snellen - Linear)      Right Left   Dist cc 20/60 +1 20/20   Dist ph cc 20/50 +2        Tonometry (Tonopen, 1:22 PM)      Right Left   Pressure 13 14       Pupils      Dark Light Shape React APD   Right 3 2 Round Brisk None   Left 3 2 Round Brisk None       Visual Fields (Counting fingers)      Left Right    Full Full       Extraocular Movement      Right Left    Full, Ortho Full, Ortho       Neuro/Psych    Oriented x3:  Yes   Mood/Affect:  Normal  Dilation    Both eyes:  1.0% Mydriacyl, 2.5% Phenylephrine @ 1:22 PM        Slit Lamp and Fundus Exam    Slit Lamp Exam      Right Left   Lids/Lashes Dermatochalasis - upper lid Dermatochalasis - upper lid, Telangiectasia, Meibomian gland dysfunction   Conjunctiva/Sclera White and quiet White and quiet   Cornea Trace Punctate epithelial erosions Arcus, 1+ diffuse Punctate epithelial erosions, Debris in tear film   Anterior Chamber 1+ Cell/pigment Deep and quiet, no cell or flare   Iris Round and dilated Round and dilated   Lens PC IOL in good position with open PC Three piece PC IOL in good position   Vitreous Vitreous syneresis, Posterior vitreous detachment Vitreous syneresis, Posterior vitreous detachment       Fundus Exam      Right Left   Disc Compact, mild Temporal Peripapillary atrophy, diffuse elevation ? Optic disc drusen Compact, Tilted disc, Temporal Peripapillary atrophy   C/D Ratio 0.0 0.1   Macula Blunted foveal reflex, +CME, Retinal pigment epithelial mottling Blunted foveal reflex, Retinal pigment epithelial mottling, No heme or edema   Vessels Tortuous, Vascular attenuation, AV crossing changes Tortuous, Vascular attenuation, AV crossing changes   Periphery Attached, mild pigmented cystiod degeneration inferiorly Attached        Refraction    Manifest Refraction   Attempted to refract pt, but  no correction helped him          IMAGING AND PROCEDURES  Imaging and Procedures for _0 @  OCT, Retina - OU - Both Eyes       Right Eye Quality was good. Central Foveal Thickness: 505. Progression has no prior data. Findings include abnormal foveal contour, intraretinal fluid, no SRF (Mild ERM).   Left Eye Quality was good. Central Foveal Thickness: 294. Progression has no prior data. Findings include normal foveal contour, no IRF, no SRF.   Notes *Images captured and stored on drive  Diagnosis / Impression:  OD: CME OS: NFP, No IRF/SRF  Clinical management:  See below  Abbreviations: NFP - Normal foveal profile. CME - cystoid macular edema. PED - pigment epithelial detachment. IRF - intraretinal fluid. SRF - subretinal fluid. EZ - ellipsoid zone. ERM - epiretinal membrane. ORA - outer retinal atrophy. ORT - outer retinal tubulation. SRHM - subretinal hyper-reflective material                  ASSESSMENT/PLAN:    ICD-10-CM   1. Cystoid macular edema, right H35.351   2. Retinal edema H35.81 OCT, Retina - OU - Both Eyes  3. Essential hypertension I10   4. Hypertensive retinopathy of both eyes H35.033   5. Posterior vitreous detachment of both eyes H43.813   6. Pseudophakia of both eyes Z96.1     1,2. CME OD-  - suspect post YAG inflammation vs prostaglandin analog related CME (pt notes vision decrease corresponds to starting Travatan - 1+ AC cell - will start PF and ketorolac OU QID - stop travatan OU -- possible contributor  - F/U 3 weeks -- repeat DFE/OCT;  check FA if no improvement on topical therapy  3,4. Hypertensive retinopathy OU - discussed importance of tight BP control - monitor  5. PVD / vitreous syneresis  Discussed findings and prognosis  No RT or RD on 360 scleral depressed exam  Reviewed s/s of RT/RD  Strict return precautions for any such RT/RD signs/symptoms  6. Pseudophakia OU  - s/p CE/IOL ([place])  - doing  well  -  monitor    Ophthalmic Meds Ordered this visit:  Meds ordered this encounter  Medications  . prednisoLONE acetate (PRED FORTE) 1 % ophthalmic suspension    Sig: Place 1 drop into the right eye 4 (four) times daily.    Dispense:  10 mL    Refill:  0  . ketorolac (ACULAR) 0.5 % ophthalmic solution    Sig: Place 1 drop into the right eye 4 (four) times daily.    Dispense:  10 mL    Refill:  2       Return in about 3 weeks (around 03/27/2018) for F/U CME OD, DFE, OCT.  There are no Patient Instructions on file for this visit.   Explained the diagnoses, plan, and follow up with the patient and they expressed understanding.  Patient expressed understanding of the importance of proper follow up care.   This document serves as a record of services personally performed by Gardiner Sleeper, MD, PhD. It was created on their behalf by Catha Brow, [place], a certified ophthalmic assistant. The creation of this record is the provider's dictation and/or activities during the visit.  Electronically signed by: Catha Brow, [place]  08.06.19 12:00 AM   Gardiner Sleeper, M.D., Ph.D. Diseases & Surgery of the Retina and Vitreous [facility]  I have reviewed the above documentation for accuracy and completeness, and I agree with the above. Gardiner Sleeper, M.D., Ph.D. 03/07/18 12:00 AM   Abbreviations: M myopia (nearsighted); A astigmatism; H hyperopia (farsighted); P presbyopia; Mrx spectacle prescription;  CTL contact lenses; OD right eye; OS left eye; OU both eyes  XT exotropia; ET esotropia; PEK punctate epithelial keratitis; PEE punctate epithelial erosions; DES dry eye syndrome; MGD meibomian gland dysfunction; ATs artificial tears; PFAT's preservative free artificial tears; [place] nuclear sclerotic cataract; PSC posterior subcapsular cataract; ERM epi-retinal membrane; PVD posterior vitreous detachment; RD retinal detachment; DM diabetes mellitus; DR diabetic retinopathy;  NPDR non-proliferative diabetic retinopathy; PDR proliferative diabetic retinopathy; CSME clinically significant macular edema; DME diabetic macular edema; dbh dot blot hemorrhages; CWS cotton wool spot; POAG primary open angle glaucoma; C/D cup-to-disc ratio; HVF humphrey visual field; GVF goldmann visual field; OCT optical coherence tomography; IOP intraocular pressure; BRVO Branch retinal vein occlusion; CRVO central retinal vein occlusion; CRAO central retinal artery occlusion; BRAO branch retinal artery occlusion; RT retinal tear; SB scleral buckle; PPV pars plana vitrectomy; VH Vitreous hemorrhage; PRP panretinal laser photocoagulation; IVK intravitreal kenalog; VMT vitreomacular traction; MH Macular hole;  NVD neovascularization of the disc; NVE neovascularization elsewhere; AREDS age related eye disease study; ARMD age related macular degeneration; POAG primary open angle glaucoma; EBMD epithelial/anterior basement membrane dystrophy; ACIOL anterior chamber intraocular lens; IOL intraocular lens; PCIOL posterior chamber intraocular lens; Phaco/IOL phacoemulsification with intraocular lens placement; [place] photorefractive keratectomy; LASIK laser assisted in situ keratomileusis; HTN hypertension; DM diabetes mellitus; COPD chronic obstructive pulmonary disease

## 2018-03-26 NOTE — Progress Notes (Signed)
[facility] Note  03/27/2018     CHIEF COMPLAINT Patient presents for Retina Follow Up   HISTORY OF PRESENT ILLNESS: Trevor Stafford. is a 72 y.o. male who presents to the clinic today for:   HPI    Retina Follow Up    Patient presents with  Other.  In right eye.  This started weeks ago.  Severity is moderate.  Duration of weeks.  Since onset it is rapidly improving.  I, the attending physician,  performed the HPI with the patient and updated documentation appropriately.          Comments    72 y/o male pt here for 3 wk f/u for CME OD.  VA better OD.  No change in [place] OS.  Denies pain, flashes, floaters.  Pred QID OD Ketorolac QID OD       Last edited by Bernarda Caffey, MD on 03/27/2018 10:40 AM. (History)      Referring physician: Rusty Aus, MD [place], [place] [address]  HISTORICAL INFORMATION:   Selected notes from the MEDICAL RECORD NUMBER Referred by Dr. Marvel Plan for concern of post Yag macular swelling OD;  LEE-  Ocular Hx-  PMH- DM (on metformin), HTN, sleep apnea     CURRENT MEDICATIONS: Current Outpatient Medications (Ophthalmic Drugs)  Medication Sig  . ketorolac (ACULAR) 0.5 % ophthalmic solution Place 1 drop into the right eye 4 (four) times daily.  Marland Kitchen loteprednol (LOTEMAX) 0.5 % ophthalmic suspension 4 (four) times daily.  . prednisoLONE acetate (PRED FORTE) 1 % ophthalmic suspension Place 1 drop into the right eye 4 (four) times daily.  . brimonidine (ALPHAGAN) 0.2 % ophthalmic solution Place 1 drop into both eyes 2 (two) times daily.   No current facility-administered medications for this visit.  (Ophthalmic Drugs)   Current Outpatient Medications (Other)  Medication Sig  . albuterol (PROVENTIL HFA;VENTOLIN HFA) 108 (90 Base) MCG/ACT inhaler Inhale into the lungs every 6 (six) hours as needed for wheezing or shortness of breath.  Marland Kitchen albuterol (VENTOLIN HFA)  108 (90 Base) MCG/ACT inhaler INHALE 2 INHALATIONS INTO THE LUNGS EVERY 6 (SIX) HOURS AS NEEDED FOR WHEEZING.  Marland Kitchen aspirin-acetaminophen-caffeine (EXCEDRIN MIGRAINE) 250-250-65 MG tablet Take by mouth every 6 (six) hours as needed for headache or migraine (PRN).  Marland Kitchen BIOGAIA PROBIOTIC (BIOGAIA PROBIOTIC) LIQD Take by mouth daily at 8 pm.  . budesonide (PULMICORT FLEXHALER) 180 MCG/ACT inhaler INHALE 2 INHALATIONS INTO THE LUNGS 2 TIMES DAILY  . budesonide (PULMICORT) 180 MCG/ACT inhaler Inhale into the lungs 2 (two) times daily.  . calcium-vitamin D (OSCAL WITH D) 500-200 MG-UNIT tablet Take 1 tablet by mouth.  . esomeprazole (NEXIUM) 40 MG capsule TAKE 1 CAPSULE BY MOUTH ONCE A DAY  . esomeprazole (NEXIUM) 40 MG packet Take 40 mg by mouth daily before breakfast.  . glimepiride (AMARYL) 1 MG tablet Take 1 mg by mouth daily with breakfast.  . hydrocortisone (ANUSOL-HC) 2.5 % rectal cream Place 1 application rectally.  . irbesartan (AVAPRO) 150 MG tablet Take 150 mg by mouth daily.  . magnesium oxide (MAG-OX) 400 MG tablet Take 400 mg by mouth daily.  . metFORMIN (GLUCOPHAGE-XR) 500 MG 24 hr tablet Take 500 mg by mouth daily with breakfast.  . mometasone (NASONEX) 50 MCG/ACT nasal spray Place 2 sprays into the nose daily.  Marland Kitchen neomycin-polymyxin-hydrocortisone (CORTISPORIN) 3.5-10000-1 OTIC suspension INSTILL 4 DROPS INTO THE AFFECTED EAR TWICE DAILY FOR 7 DAYS  .  sildenafil (REVATIO) 20 MG tablet Take 20 mg by mouth 3 (three) times daily.  Marland Kitchen testosterone cypionate (DEPOTESTOSTERONE CYPIONATE) 200 MG/ML injection INJECT 1.5 MLS (300 MG TOTAL) INTO THE MUSCLE EVERY 28 (TWENTY-EIGHT) DAYS  . testosterone cypionate (DEPOTESTOTERONE CYPIONATE) 100 MG/ML injection Inject 200 mg into the muscle every 14 (fourteen) days. For IM use only   No current facility-administered medications for this visit.  (Other)      REVIEW OF SYSTEMS: ROS    Positive for: Endocrine, Eyes   Negative for: Constitutional,  Gastrointestinal, Neurological, Skin, Genitourinary, Musculoskeletal, HENT, Cardiovascular, Respiratory, Psychiatric, Allergic/Imm, Heme/Lymph   Last edited by Matthew Folks, COA on 03/27/2018 10:10 AM. (History)       ALLERGIES Allergies  Allergen Reactions  . Biaxin [Clarithromycin] Nausea Only  . Codeine Sulfate Nausea Only    PAST MEDICAL HISTORY Past Medical History:  Diagnosis Date  . Barrett esophagus   . Diabetes mellitus without complication (HCC)    Type 2  . GERD (gastroesophageal reflux disease)   . Hyperlipidemia   . Hypertension   . Hypotestosteronemia   . OSA (obstructive sleep apnea)   . Reactive airway disease   . Seasonal allergies    Past Surgical History:  Procedure Laterality Date  . CATARACT EXTRACTION    . CATARACT EXTRACTION, BILATERAL  07/01/2001  . COLONOSCOPY Right 2009/2014  . COLONOSCOPY WITH ESOPHAGOGASTRODUODENOSCOPY (EGD)  2001/2005/2009/2012/2015  . ESOPHAGOGASTRODUODENOSCOPY (EGD) WITH PROPOFOL N/A 12/28/2016   Procedure: ESOPHAGOGASTRODUODENOSCOPY (EGD) WITH PROPOFOL;  Surgeon: Manya Silvas, MD;  Location: [facility] ENDOSCOPY;  Service: Endoscopy;  Laterality: N/A;  . FLEXIBLE SIGMOIDOSCOPY  03/13/2000  . SEPTOPLASTY  03/13/2000  . YAG LASER APPLICATION      FAMILY HISTORY Family History  Problem Relation Age of Onset  . Cataracts Mother   . Amblyopia Neg Hx   . Blindness Neg Hx   . Glaucoma Neg Hx   . Macular degeneration Neg Hx   . Retinal detachment Neg Hx   . Strabismus Neg Hx   . Retinitis pigmentosa Neg Hx     SOCIAL HISTORY Social History   Tobacco Use  . Smoking status: Never Smoker  . Smokeless tobacco: Never Used  Substance Use Topics  . Alcohol use: No  . Drug use: No         OPHTHALMIC EXAM:  Base Eye Exam    Visual Acuity (Snellen - Linear)      Right Left   Dist [place] 20/20 -2 20/200   Dist ph [place]  20/50  Pt states he does not wear rx glasses for distance.  Has had cat sx OU, OD for distance, and  OS for near.       Tonometry (Tonopen, 10:18 AM)      Right Left   Pressure 18 15       Pupils      Dark Light Shape React APD   Right 3 2 Round Brisk None   Left 3 2 Round Brisk None       Visual Fields (Counting fingers)      Left Right    Full Full       Extraocular Movement      Right Left    Full, Ortho Full, Ortho       Neuro/Psych    Oriented x3:  Yes   Mood/Affect:  Normal       Dilation    Both eyes:  1.0% Mydriacyl, 2.5% Phenylephrine @ 10:18 AM  Slit Lamp and Fundus Exam    Slit Lamp Exam      Right Left   Lids/Lashes Dermatochalasis - upper lid Dermatochalasis - upper lid, Telangiectasia, Meibomian gland dysfunction   Conjunctiva/Sclera White and quiet White and quiet   Cornea Inferior 1+ Punctate epithelial erosions, Arcus Arcus, 1+ diffuse Punctate epithelial erosions, Debris in tear film   Anterior Chamber Deep and quiet Deep and quiet, no cell or flare   Iris Round and dilated Round and dilated   Lens PC IOL in good position with open PC Three piece PC IOL in good position   Vitreous Vitreous syneresis, Posterior vitreous detachment Vitreous syneresis, Posterior vitreous detachment       Fundus Exam      Right Left   Disc Compact, mild Temporal Peripapillary atrophy, diffuse elevation ? Optic disc drusen Compact, Tilted disc, Temporal Peripapillary atrophy   C/D Ratio 0.0 0.1   Macula Blunted foveal reflex, +CME -- improved, Retinal pigment epithelial mottling, mild Epiretinal membrane Blunted foveal reflex, Retinal pigment epithelial mottling, No heme or edema   Vessels Tortuous, Vascular attenuation, AV crossing changes Tortuous, Vascular attenuation, AV crossing changes   Periphery Attached, mild pigmented cystiod degeneration inferiorly Attached          IMAGING AND PROCEDURES  Imaging and Procedures for @TODAY @  OCT, Retina - OU - Both Eyes       Right Eye Quality was good. Central Foveal Thickness: 353. Progression has  improved. Findings include no SRF, normal foveal contour, no IRF (Mild ERM).   Left Eye Quality was good. Central Foveal Thickness: 297. Progression has been stable. Findings include normal foveal contour, no IRF, no SRF.   Notes *Images captured and stored on drive  Diagnosis / Impression:  OD: CME improved / resolved OS: NFP, No IRF/SRF  Clinical management:  See below  Abbreviations: NFP - Normal foveal profile. CME - cystoid macular edema. PED - pigment epithelial detachment. IRF - intraretinal fluid. SRF - subretinal fluid. EZ - ellipsoid zone. ERM - epiretinal membrane. ORA - outer retinal atrophy. ORT - outer retinal tubulation. SRHM - subretinal hyper-reflective material                  ASSESSMENT/PLAN:    ICD-10-CM   1. 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 Retinal edema H35.81 OCT, Retina - OU - Both Eyes  3. Essential hypertension I10   4. Hypertensive retinopathy of both eyes H35.033   5. Posterior vitreous detachment of both eyes H43.813   6. Pseudophakia of both eyes Z96.1     1,2. CME OD-  - suspect post YAG inflammation vs prostaglandin analog related CME (pt notes vision decrease corresponds to starting Travatan) - CME improved / resolved on PF and ketorolac QID OD; and cessation of latanoprost - AC cell resolved - will start PF taper QID x7 days, TID x7 days, BID x7 days, QD x7 days, then stop - stop Ketorolac - off travatan OU -- possible contributor  - IOP slightly higher OD - start Brimonidine OU BID - F/U 4 weeks -- repeat DFE/OCT;  3,4. Hypertensive retinopathy OU - discussed importance of tight BP control - monitor  5. PVD / vitreous syneresis  Discussed findings and prognosis  No RT or RD on 360 scleral depressed exam  Reviewed s/s of RT/RD  Strict return precautions for any such RT/RD signs/symptoms  6. Pseudophakia OU  - s/p CE/IOL ([place])  - doing well  -  monitor    Ophthalmic Meds Ordered  this visit:  Meds ordered this encounter  Medications  . brimonidine (ALPHAGAN) 0.2 % ophthalmic solution    Sig: Place 1 drop into both eyes 2 (two) times daily.    Dispense:  5 mL    Refill:  1       Return in about 4 weeks (around 04/24/2018) for F/U CME OD, DFE, OCT.  There are no Patient Instructions on file for this visit.   Explained the diagnoses, plan, and follow up with the patient and they expressed understanding.  Patient expressed understanding of the importance of proper follow up care.   This document serves as a record of services personally performed by Gardiner Sleeper, MD, PhD. It was created on their behalf by Ernest Mallick, OA, an ophthalmic assistant. The creation of this record is the provider's dictation and/or activities during the visit.    Electronically signed by: Ernest Mallick, OA  08.26.2019 12:02 PM    Gardiner Sleeper, M.D., Ph.D. Diseases & Surgery of the Retina and Vitreous [facility]  I have reviewed the above documentation for accuracy and completeness, and I agree with the above. Gardiner Sleeper, M.D., Ph.D. 03/27/18 12:04 PM    Abbreviations: M myopia (nearsighted); A astigmatism; H hyperopia (farsighted); P presbyopia; Mrx spectacle prescription;  CTL contact lenses; OD right eye; OS left eye; OU both eyes  XT exotropia; ET esotropia; PEK punctate epithelial keratitis; PEE punctate epithelial erosions; DES dry eye syndrome; MGD meibomian gland dysfunction; ATs artificial tears; PFAT's preservative free artificial tears; [place] nuclear sclerotic cataract; PSC posterior subcapsular cataract; ERM epi-retinal membrane; PVD posterior vitreous detachment; RD retinal detachment; DM diabetes mellitus; DR diabetic retinopathy; NPDR non-proliferative diabetic retinopathy; PDR proliferative diabetic retinopathy; CSME clinically significant macular edema; DME diabetic macular edema; dbh dot blot hemorrhages; CWS cotton wool spot; POAG primary  open angle glaucoma; C/D cup-to-disc ratio; HVF humphrey visual field; GVF goldmann visual field; OCT optical coherence tomography; IOP intraocular pressure; BRVO Branch retinal vein occlusion; CRVO central retinal vein occlusion; CRAO central retinal artery occlusion; BRAO branch retinal artery occlusion; RT retinal tear; SB scleral buckle; PPV pars plana vitrectomy; VH Vitreous hemorrhage; PRP panretinal laser photocoagulation; IVK intravitreal kenalog; VMT vitreomacular traction; MH Macular hole;  NVD neovascularization of the disc; NVE neovascularization elsewhere; AREDS age related eye disease study; ARMD age related macular degeneration; POAG primary open angle glaucoma; EBMD epithelial/anterior basement membrane dystrophy; ACIOL anterior chamber intraocular lens; IOL intraocular lens; PCIOL posterior chamber intraocular lens; Phaco/IOL phacoemulsification with intraocular lens placement; [place] photorefractive keratectomy; LASIK laser assisted in situ keratomileusis; HTN hypertension; DM diabetes mellitus; COPD chronic obstructive pulmonary disease

## 2018-03-27 ENCOUNTER — Encounter (INDEPENDENT_AMBULATORY_CARE_PROVIDER_SITE_OTHER): Payer: Self-pay | Admitting: Ophthalmology

## 2018-03-27 ENCOUNTER — Ambulatory Visit (INDEPENDENT_AMBULATORY_CARE_PROVIDER_SITE_OTHER): Payer: Medicare HMO | Admitting: Ophthalmology

## 2018-03-27 DIAGNOSIS — H35351 Cystoid macular degeneration, right eye: Secondary | ICD-10-CM | POA: Diagnosis not present

## 2018-03-27 DIAGNOSIS — I10 Essential (primary) hypertension: Secondary | ICD-10-CM | POA: Diagnosis not present

## 2018-03-27 DIAGNOSIS — H3581 Retinal edema: Secondary | ICD-10-CM | POA: Diagnosis not present

## 2018-03-27 DIAGNOSIS — Z961 Presence of intraocular lens: Secondary | ICD-10-CM

## 2018-03-27 DIAGNOSIS — H43813 Vitreous degeneration, bilateral: Secondary | ICD-10-CM

## 2018-03-27 DIAGNOSIS — H35033 Hypertensive retinopathy, bilateral: Secondary | ICD-10-CM

## 2018-03-27 MED ORDER — BRIMONIDINE TARTRATE 0.2 % OP SOLN: 1.0000 [drp] | Freq: Two times a day (BID) | OPHTHALMIC | 1 refills | Status: DC

## 2018-04-24 NOTE — Progress Notes (Signed)
[facility] Note  04/25/2018     CHIEF COMPLAINT Patient presents for Retina Follow Up   HISTORY OF PRESENT ILLNESS: Trevor Stafford. is a 72 y.o. male who presents to the clinic today for:   HPI    Retina Follow Up    Patient presents with  Other.  In right eye.  Severity is moderate.  Duration of 4 weeks.  Since onset it is stable.  I, the attending physician,  performed the HPI with the patient and updated documentation appropriately.          Comments    Pt presents for CME OD f/u, pt states he has just finished up his PF, pt states [facility] has been good since last visit, pt denies FOL, floaters, pain or wavy vision, pt is using Brimonidine BID       Last edited by Bernarda Caffey, MD on 04/25/2018 12:18 PM. (History)      Referring physician: [facility] [address] [place], [place] [address]  HISTORICAL INFORMATION:   Selected notes from the MEDICAL RECORD NUMBER Referred by Dr. Marvel Plan for concern of post Yag macular swelling OD;  LEE-  Ocular Hx-  PMH- DM (on metformin), HTN, sleep apnea     CURRENT MEDICATIONS: Current Outpatient Medications (Ophthalmic Drugs)  Medication Sig  . brimonidine (ALPHAGAN) 0.2 % ophthalmic solution Place 1 drop into both eyes 2 (two) times daily.  Marland Kitchen ketorolac (ACULAR) 0.5 % ophthalmic solution Place 1 drop into the right eye 4 (four) times daily.  Marland Kitchen loteprednol (LOTEMAX) 0.5 % ophthalmic suspension 4 (four) times daily.  . prednisoLONE acetate (PRED FORTE) 1 % ophthalmic suspension Place 1 drop into the right eye 4 (four) times daily. (Patient not taking: Reported on 04/25/2018)   No current facility-administered medications for this visit.  (Ophthalmic Drugs)   Current Outpatient Medications (Other)  Medication Sig  . albuterol (PROVENTIL HFA;VENTOLIN HFA) 108 (90 Base) MCG/ACT inhaler Inhale into the lungs every 6 (six) hours as needed for wheezing or shortness of breath.  Marland Kitchen albuterol  (VENTOLIN HFA) 108 (90 Base) MCG/ACT inhaler INHALE 2 INHALATIONS INTO THE LUNGS EVERY 6 (SIX) HOURS AS NEEDED FOR WHEEZING.  Marland Kitchen aspirin-acetaminophen-caffeine (EXCEDRIN MIGRAINE) 250-250-65 MG tablet Take by mouth every 6 (six) hours as needed for headache or migraine (PRN).  Marland Kitchen BIOGAIA PROBIOTIC (BIOGAIA PROBIOTIC) LIQD Take by mouth daily at 8 pm.  . budesonide (PULMICORT FLEXHALER) 180 MCG/ACT inhaler INHALE 2 INHALATIONS INTO THE LUNGS 2 TIMES DAILY  . budesonide (PULMICORT) 180 MCG/ACT inhaler Inhale into the lungs 2 (two) times daily.  . calcium-vitamin D (OSCAL WITH D) 500-200 MG-UNIT tablet Take 1 tablet by mouth.  . esomeprazole (NEXIUM) 40 MG capsule TAKE 1 CAPSULE BY MOUTH ONCE A DAY  . esomeprazole (NEXIUM) 40 MG packet Take 40 mg by mouth daily before breakfast.  . glimepiride (AMARYL) 1 MG tablet Take 1 mg by mouth daily with breakfast.  . hydrocortisone (ANUSOL-HC) 2.5 % rectal cream Place 1 application rectally.  . irbesartan (AVAPRO) 150 MG tablet Take 150 mg by mouth daily.  . magnesium oxide (MAG-OX) 400 MG tablet Take 400 mg by mouth daily.  . metFORMIN (GLUCOPHAGE-XR) 500 MG 24 hr tablet Take 500 mg by mouth daily with breakfast.  . mometasone (NASONEX) 50 MCG/ACT nasal spray Place 2 sprays into the nose daily.  Marland Kitchen neomycin-polymyxin-hydrocortisone (CORTISPORIN) 3.5-10000-1 OTIC suspension INSTILL 4 DROPS INTO THE AFFECTED EAR TWICE DAILY FOR 7 DAYS  . sildenafil (  REVATIO) 20 MG tablet Take 20 mg by mouth 3 (three) times daily.  Marland Kitchen testosterone cypionate (DEPOTESTOSTERONE CYPIONATE) 200 MG/ML injection INJECT 1.5 MLS (300 MG TOTAL) INTO THE MUSCLE EVERY 28 (TWENTY-EIGHT) DAYS  . testosterone cypionate (DEPOTESTOTERONE CYPIONATE) 100 MG/ML injection Inject 200 mg into the muscle every 14 (fourteen) days. For IM use only   No current facility-administered medications for this visit.  (Other)      REVIEW OF SYSTEMS: ROS    Positive for: Endocrine, Cardiovascular, Eyes    Negative for: Constitutional, Gastrointestinal, Neurological, Skin, Genitourinary, Musculoskeletal, HENT, Respiratory, Psychiatric, Allergic/Imm, Heme/Lymph   Last edited by Debbrah Alar, COT on 04/25/2018 10:12 AM. (History)       ALLERGIES Allergies  Allergen Reactions  . Biaxin [Clarithromycin] Nausea Only  . Codeine Sulfate Nausea Only    PAST MEDICAL HISTORY Past Medical History:  Diagnosis Date  . Barrett esophagus   . Diabetes mellitus without complication (HCC)    Type 2  . GERD (gastroesophageal reflux disease)   . Hyperlipidemia   . Hypertension   . Hypotestosteronemia   . OSA (obstructive sleep apnea)   . Reactive airway disease   . Seasonal allergies    Past Surgical History:  Procedure Laterality Date  . CATARACT EXTRACTION    . CATARACT EXTRACTION, BILATERAL  07/01/2001  . COLONOSCOPY Right 2009/2014  . COLONOSCOPY WITH ESOPHAGOGASTRODUODENOSCOPY (EGD)  2001/2005/2009/2012/2015  . ESOPHAGOGASTRODUODENOSCOPY (EGD) WITH PROPOFOL N/A 12/28/2016   Procedure: ESOPHAGOGASTRODUODENOSCOPY (EGD) WITH PROPOFOL;  Surgeon: Manya Silvas, MD;  Location: [facility] ENDOSCOPY;  Service: Endoscopy;  Laterality: N/A;  . FLEXIBLE SIGMOIDOSCOPY  03/13/2000  . SEPTOPLASTY  03/13/2000  . YAG LASER APPLICATION      FAMILY HISTORY Family History  Problem Relation Age of Onset  . Cataracts Mother   . Amblyopia Neg Hx   . Blindness Neg Hx   . Glaucoma Neg Hx   . Macular degeneration Neg Hx   . Retinal detachment Neg Hx   . Strabismus Neg Hx   . Retinitis pigmentosa Neg Hx     SOCIAL HISTORY Social History   Tobacco Use  . Smoking status: Never Smoker  . Smokeless tobacco: Never Used  Substance Use Topics  . Alcohol use: No  . Drug use: No         OPHTHALMIC EXAM:  Base Eye Exam    Visual Acuity (Snellen - Linear)      Right Left   Dist [place] 20/25 -1 20/20   Dist ph [place] NI        Tonometry (Tonopen, 10:17 AM)      Right Left   Pressure 21 16        Pupils      Dark Light Shape React APD   Right 3 2 Round Brisk None   Left 3 2 Round Brisk None       Visual Fields (Counting fingers)      Left Right    Full Full       Extraocular Movement      Right Left    Full, Ortho Full, Ortho       Neuro/Psych    Oriented x3:  Yes   Mood/Affect:  Normal       Dilation    Both eyes:  1.0% Mydriacyl, 2.5% Phenylephrine @ 10:17 AM        Slit Lamp and Fundus Exam    Slit Lamp Exam      Right Left   Lids/Lashes Dermatochalasis -  upper lid Dermatochalasis - upper lid, Telangiectasia, Meibomian gland dysfunction   Conjunctiva/Sclera White and quiet White and quiet   Cornea Inferior 1+ Punctate epithelial erosions, Arcus Arcus, 1-2+ inferior Punctate epithelial erosions, Debris in tear film   Anterior Chamber Deep and quiet; no cell or flare Deep and quiet, no cell or flare   Iris Round and dilated Round and dilated   Lens PC IOL in good position with open PC Three piece PC IOL in good position   Vitreous Vitreous syneresis, Posterior vitreous detachment Vitreous syneresis, Posterior vitreous detachment       Fundus Exam      Right Left   Disc Compact, mild Temporal Peripapillary atrophy, diffuse elevation ? Optic disc drusen Compact, Tilted disc, Temporal Peripapillary atrophy   C/D Ratio 0.0 0.1   Macula Good foveal reflex, CME resolved, Retinal pigment epithelial mottling, mild Epiretinal membrane Blunted foveal reflex, Retinal pigment epithelial mottling, No heme or edema   Vessels Tortuous, Vascular attenuation, AV crossing changes Tortuous, Vascular attenuation, AV crossing changes   Periphery Attached, mild pigmented cystiod degeneration inferiorly Attached          IMAGING AND PROCEDURES  Imaging and Procedures for @TODAY @  OCT, Retina - OU - Both Eyes       Right Eye Quality was good. Central Foveal Thickness: 328. Progression has been stable. Findings include no SRF, normal foveal contour, no IRF (Mild ERM).    Left Eye Quality was good. Central Foveal Thickness: 294. Progression has been stable. Findings include normal foveal contour, no IRF, no SRF.   Notes *Images captured and stored on drive  Diagnosis / Impression:  OD: NFP; CME resolved -- no IRF/SRF OS: NFP, No IRF/SRF  Clinical management:  See below  Abbreviations: NFP - Normal foveal profile. CME - cystoid macular edema. PED - pigment epithelial detachment. IRF - intraretinal fluid. SRF - subretinal fluid. EZ - ellipsoid zone. ERM - epiretinal membrane. ORA - outer retinal atrophy. ORT - outer retinal tubulation. SRHM - subretinal hyper-reflective material                  ASSESSMENT/PLAN:    ICD-10-CM   1. 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 Retinal edema H35.81 OCT, Retina - OU - Both Eyes  3. Essential hypertension I10   4. Hypertensive retinopathy of both eyes H35.033   5. Posterior vitreous detachment of both eyes H43.813   6. Pseudophakia of both eyes Z96.1     1,2. CME OD-  - suspect post YAG inflammation vs prostaglandin analog related CME (pt notes vision decrease corresponds to starting Travatan) - CME remains improved / resolved with stoppage of ketorolac and tapering off PF - AC cell resolved - off travatan OU -- possible contributor  - IOP slightly higher OD - cont Brimonidine OU BID for now -- defer IOP management to Dr. Marvel Plan - F/U PRN - will dictate letter to Dr. Marvel Plan  3,4. Hypertensive retinopathy OU - discussed importance of tight BP control - monitor  5. PVD / vitreous syneresis  Discussed findings and prognosis  No RT or RD on 360 scleral depressed exam  Reviewed s/s of RT/RD  Strict return precautions for any such RT/RD signs/symptoms  6. Pseudophakia OU  - s/p CE/IOL ([place])  - doing well  - monitor    Ophthalmic Meds Ordered this visit:  No orders of the defined types were placed in this encounter.  Return if symptoms  worsen or fail to improve.  There are no Patient Instructions on file for this visit.   Explained the diagnoses, plan, and follow up with the patient and they expressed understanding.  Patient expressed understanding of the importance of proper follow up care.   This document serves as a record of services personally performed by Gardiner Sleeper, MD, PhD. It was created on their behalf by Ernest Mallick, OA, an ophthalmic assistant. The creation of this record is the provider's dictation and/or activities during the visit.    Electronically signed by: Ernest Mallick, OA  09.24.19 12:20 PM   This document serves as a record of services personally performed by Gardiner Sleeper, MD, PhD. It was created on their behalf by Catha Brow, [place], a certified ophthalmic assistant. The creation of this record is the provider's dictation and/or activities during the visit.  Electronically signed by: Catha Brow, [place]  09.25.19 12:20 PM   Gardiner Sleeper, M.D., Ph.D. Diseases & Surgery of the Retina and Vitreous [facility]  I have reviewed the above documentation for accuracy and completeness, and I agree with the above. Gardiner Sleeper, M.D., Ph.D. 04/25/18 12:22 PM   Abbreviations: M myopia (nearsighted); A astigmatism; H hyperopia (farsighted); P presbyopia; Mrx spectacle prescription;  CTL contact lenses; OD right eye; OS left eye; OU both eyes  XT exotropia; ET esotropia; PEK punctate epithelial keratitis; PEE punctate epithelial erosions; DES dry eye syndrome; MGD meibomian gland dysfunction; ATs artificial tears; PFAT's preservative free artificial tears; Athena nuclear sclerotic cataract; PSC posterior subcapsular cataract; ERM epi-retinal membrane; PVD posterior vitreous detachment; RD retinal detachment; DM diabetes mellitus; DR diabetic retinopathy; NPDR non-proliferative diabetic retinopathy; PDR proliferative diabetic retinopathy; CSME clinically significant macular  edema; DME diabetic macular edema; dbh dot blot hemorrhages; CWS cotton wool spot; POAG primary open angle glaucoma; C/D cup-to-disc ratio; HVF humphrey visual field; GVF goldmann visual field; OCT optical coherence tomography; IOP intraocular pressure; BRVO Branch retinal vein occlusion; CRVO central retinal vein occlusion; CRAO central retinal artery occlusion; BRAO branch retinal artery occlusion; RT retinal tear; SB scleral buckle; PPV pars plana vitrectomy; VH Vitreous hemorrhage; PRP panretinal laser photocoagulation; IVK intravitreal kenalog; VMT vitreomacular traction; MH Macular hole;  NVD neovascularization of the disc; NVE neovascularization elsewhere; AREDS age related eye disease study; ARMD age related macular degeneration; POAG primary open angle glaucoma; EBMD epithelial/anterior basement membrane dystrophy; ACIOL anterior chamber intraocular lens; IOL intraocular lens; PCIOL posterior chamber intraocular lens; Phaco/IOL phacoemulsification with intraocular lens placement; [place] photorefractive keratectomy; LASIK laser assisted in situ keratomileusis; HTN hypertension; DM diabetes mellitus; COPD chronic obstructive pulmonary disease

## 2018-04-25 ENCOUNTER — Encounter (INDEPENDENT_AMBULATORY_CARE_PROVIDER_SITE_OTHER): Payer: Self-pay | Admitting: Ophthalmology

## 2018-04-25 ENCOUNTER — Ambulatory Visit (INDEPENDENT_AMBULATORY_CARE_PROVIDER_SITE_OTHER): Payer: Medicare HMO | Admitting: Ophthalmology

## 2018-04-25 DIAGNOSIS — Z961 Presence of intraocular lens: Secondary | ICD-10-CM

## 2018-04-25 DIAGNOSIS — H3581 Retinal edema: Secondary | ICD-10-CM | POA: Diagnosis not present

## 2018-04-25 DIAGNOSIS — H35033 Hypertensive retinopathy, bilateral: Secondary | ICD-10-CM

## 2018-04-25 DIAGNOSIS — H35351 Cystoid macular degeneration, right eye: Secondary | ICD-10-CM | POA: Diagnosis not present

## 2018-04-25 DIAGNOSIS — I10 Essential (primary) hypertension: Secondary | ICD-10-CM | POA: Diagnosis not present

## 2018-04-25 DIAGNOSIS — H43813 Vitreous degeneration, bilateral: Secondary | ICD-10-CM

## 2018-05-08 ENCOUNTER — Other Ambulatory Visit (INDEPENDENT_AMBULATORY_CARE_PROVIDER_SITE_OTHER): Payer: Self-pay | Admitting: Ophthalmology

## 2018-06-06 ENCOUNTER — Ambulatory Visit: Payer: Medicare HMO | Admitting: Anesthesiology

## 2018-06-06 ENCOUNTER — Encounter: Payer: Self-pay | Admitting: Emergency Medicine

## 2018-06-06 ENCOUNTER — Ambulatory Visit
Admission: RE | Admit: 2018-06-06 | Discharge: 2018-06-06 | Disposition: A | Discharge status: Home / Self Care | Payer: Medicare HMO | Source: Ambulatory Visit | Attending: Unknown Physician Specialty | Admitting: Unknown Physician Specialty

## 2018-06-06 ENCOUNTER — Encounter
Admission: RE | Disposition: A | Discharge status: Home / Self Care | Payer: Self-pay | Source: Ambulatory Visit | Attending: Unknown Physician Specialty

## 2018-06-06 DIAGNOSIS — Z86010 Personal history of colonic polyps: Secondary | ICD-10-CM | POA: Diagnosis not present

## 2018-06-06 DIAGNOSIS — Q438 Other specified congenital malformations of intestine: Secondary | ICD-10-CM | POA: Insufficient documentation

## 2018-06-06 DIAGNOSIS — K640 First degree hemorrhoids: Secondary | ICD-10-CM | POA: Diagnosis not present

## 2018-06-06 DIAGNOSIS — K5730 Diverticulosis of large intestine without perforation or abscess without bleeding: Secondary | ICD-10-CM | POA: Diagnosis not present

## 2018-06-06 DIAGNOSIS — Z1211 Encounter for screening for malignant neoplasm of colon: Secondary | ICD-10-CM | POA: Diagnosis not present

## 2018-06-06 HISTORY — PX: COLONOSCOPY WITH PROPOFOL: SHX5780

## 2018-06-06 HISTORY — DX: Benign neoplasm of colon, unspecified: D12.6

## 2018-06-06 SURGERY — COLONOSCOPY WITH PROPOFOL
Anesthesia: General

## 2018-06-06 MED ORDER — SODIUM CHLORIDE 0.9 % IV SOLN: INTRAVENOUS | Status: DC

## 2018-06-06 MED ORDER — PROPOFOL 10 MG/ML IV BOLUS
INTRAVENOUS | Status: DC | PRN
  Administered 2018-06-06: 50 mg via INTRAVENOUS
  Administered 2018-06-06: 80 mg via INTRAVENOUS
  Administered 2018-06-06: 20 mg via INTRAVENOUS
  Administered 2018-06-06: 40 mg via INTRAVENOUS

## 2018-06-06 MED ORDER — PROPOFOL 500 MG/50ML IV EMUL
INTRAVENOUS | Status: DC | PRN
  Administered 2018-06-06: 100 ug/kg/min via INTRAVENOUS

## 2018-06-06 MED ORDER — SODIUM CHLORIDE 0.9 % IV SOLN
INTRAVENOUS | Status: DC
  Administered 2018-06-06: 08:00:00 via INTRAVENOUS
  Administered 2018-06-06: 1000 mL via INTRAVENOUS

## 2018-06-06 MED ORDER — PROPOFOL 10 MG/ML IV BOLUS
INTRAVENOUS | Status: AC
  Filled 2018-06-06: qty 40

## 2018-06-06 MED ORDER — LIDOCAINE HCL (CARDIAC) PF 100 MG/5ML IV SOSY
PREFILLED_SYRINGE | INTRAVENOUS | Status: DC | PRN
  Administered 2018-06-06: 80 mg via INTRAVENOUS

## 2018-06-06 NOTE — Transfer of Care (Signed)
Immediate Anesthesia Transfer of Care Note  Patient: Trevor Stafford.  Procedure(s) Performed: COLONOSCOPY WITH PROPOFOL (N/A )  Patient Location: PACU  Anesthesia Type:General  Level of Consciousness: awake, alert  and oriented  Airway & Oxygen Therapy: Patient Spontanous Breathing and Patient connected to nasal cannula oxygen  Post-op Assessment: Report given to RN and Post -op Vital signs reviewed and stable  Post vital signs: Reviewed and stable  Last Vitals:  Vitals Value Taken Time  BP    Temp    Pulse 84 06/06/2018  8:49 AM  Resp    SpO2 97 % 06/06/2018  8:49 AM  Vitals shown include unvalidated device data.  Last Pain:  Vitals:   06/06/18 0849  TempSrc: (P) Tympanic  PainSc:          Complications: No apparent anesthesia complications

## 2018-06-06 NOTE — Op Note (Signed)
[facility] Patient Name: Trevor Stafford Procedure Date: 06/06/2018 8:08 AM MRN: [id number] Account #: [id number] Date of Birth: 02/07/1946 Admit Type: Outpatient Age: 72 Room: [facility] ENDO ROOM 2 Gender: Male Note Status: Finalized Procedure:            Colonoscopy Indications:          High risk colon cancer surveillance: Personal history                        of colonic polyps Providers:            Manya Silvas, MD Referring MD:         Rusty Aus, MD (Referring MD) Medicines:            Propofol per Anesthesia Complications:        No immediate complications. Procedure:            Pre-Anesthesia Assessment:                       - After reviewing the risks and benefits, the patient                        was deemed in satisfactory condition to undergo the                        procedure.                       After obtaining informed consent, the colonoscope was                        passed under direct vision. Throughout the procedure,                        the patient's blood pressure, pulse, and oxygen                        saturations were monitored continuously. The                        Colonoscope was introduced through the anus and                        advanced to the the cecum, identified by appendiceal                        orifice and ileocecal valve. The colonoscopy was                        somewhat difficult due to restricted mobility of the                        colon. Successful completion of the procedure was aided                        by applying abdominal pressure. The patient tolerated                        the procedure well. The quality of the bowel  preparation was good. Findings:      Internal hemorrhoids were found during endoscopy. The hemorrhoids were       small and Grade I (internal hemorrhoids that do not prolapse).      A few medium-mouthed diverticula were found in  the sigmoid colon.      The sigmoid colon was mildly tortuous.      The exam was otherwise without abnormality. Impression:           - Internal hemorrhoids.                       - Diverticulosis in the sigmoid colon.                       - Tortuous colon.                       - The examination was otherwise normal.                       - No specimens collected. Recommendation:       - right side of prostate firmer than left. Manya Silvas, MD 06/06/2018 8:50:14 AM This report has been signed electronically. Number of Addenda: 0 Note Initiated On: 06/06/2018 8:08 AM Scope Withdrawal Time: 0 hours 7 minutes 26 seconds  Total Procedure Duration: 0 hours 18 minutes 3 seconds       [facility]

## 2018-06-06 NOTE — H&P (Signed)
Primary Care Physician:  Rusty Aus, MD Primary Gastroenterologist:  Dr. Vira Agar  Pre-Procedure History & Physical: HPI:  Trevor Stafford. is a 72 y.o. male is here for an colonoscopy.   Past Medical History:  Diagnosis Date  . Barrett esophagus   . Colon adenomas   . Diabetes mellitus without complication (HCC)    Type 2  . GERD (gastroesophageal reflux disease)   . Hyperlipidemia   . Hypertension   . Hypotestosteronemia   . Hypotestosteronemia   . OSA (obstructive sleep apnea)   . Reactive airway disease   . Seasonal allergies     Past Surgical History:  Procedure Laterality Date  . CATARACT EXTRACTION    . CATARACT EXTRACTION, BILATERAL  07/01/2001  . COLONOSCOPY Right 2009/2014  . COLONOSCOPY WITH ESOPHAGOGASTRODUODENOSCOPY (EGD)  2001/2005/2009/2012/2015  . ESOPHAGOGASTRODUODENOSCOPY (EGD) WITH PROPOFOL N/A 12/28/2016   Procedure: ESOPHAGOGASTRODUODENOSCOPY (EGD) WITH PROPOFOL;  Surgeon: Manya Silvas, MD;  Location: [facility] ENDOSCOPY;  Service: Endoscopy;  Laterality: N/A;  . FLEXIBLE SIGMOIDOSCOPY  03/13/2000  . SEPTOPLASTY  03/13/2000  . YAG LASER APPLICATION      Prior to Admission medications   Medication Sig Start Date End Date Taking? Authorizing Provider  albuterol (PROVENTIL HFA;VENTOLIN HFA) 108 (90 Base) MCG/ACT inhaler Inhale into the lungs every 6 (six) hours as needed for wheezing or shortness of breath.   Yes [provider]  albuterol (VENTOLIN HFA) 108 (90 Base) MCG/ACT inhaler INHALE 2 INHALATIONS INTO THE LUNGS EVERY 6 (SIX) HOURS AS NEEDED FOR WHEEZING. 09/29/17  Yes [provider]  aspirin-acetaminophen-caffeine (EXCEDRIN MIGRAINE) 825-053-97 MG tablet Take by mouth every 6 (six) hours as needed for headache or migraine (PRN).   Yes [provider]  BIOGAIA PROBIOTIC (BIOGAIA PROBIOTIC) LIQD Take by mouth daily at 8 pm.   Yes [provider]  brimonidine (ALPHAGAN) 0.2 % ophthalmic solution INSTILL 1  DROP INTO BOTH EYES TWICE A DAY 05/09/18  Yes Bernarda Caffey, MD  budesonide (PULMICORT FLEXHALER) 180 MCG/ACT inhaler INHALE 2 INHALATIONS INTO THE LUNGS 2 TIMES DAILY 03/02/17  Yes [provider]  budesonide (PULMICORT) 180 MCG/ACT inhaler Inhale into the lungs 2 (two) times daily.   Yes [provider]  calcium-vitamin D (OSCAL WITH D) 500-200 MG-UNIT tablet Take 1 tablet by mouth.   Yes [provider]  esomeprazole (NEXIUM) 40 MG capsule TAKE 1 CAPSULE BY MOUTH ONCE A DAY 04/07/15  Yes [provider]  esomeprazole (NEXIUM) 40 MG packet Take 40 mg by mouth daily before breakfast.   Yes [provider]  glimepiride (AMARYL) 1 MG tablet Take 1 mg by mouth daily with breakfast.   Yes [provider]  hydrocortisone (ANUSOL-HC) 2.5 % rectal cream Place 1 application rectally.   Yes [provider]  irbesartan (AVAPRO) 150 MG tablet Take 150 mg by mouth daily.   Yes [provider]  ketorolac (ACULAR) 0.5 % ophthalmic solution Place 1 drop into the right eye 4 (four) times daily. 03/06/18 03/06/19 Yes Bernarda Caffey, MD  loteprednol (LOTEMAX) 0.5 % ophthalmic suspension 4 (four) times daily.   Yes [provider]  magnesium oxide (MAG-OX) 400 MG tablet Take 400 mg by mouth daily.   Yes [provider]  metFORMIN (GLUCOPHAGE-XR) 500 MG 24 hr tablet Take 500 mg by mouth daily with breakfast.   Yes [provider]  mometasone (NASONEX) 50 MCG/ACT nasal spray Place 2 sprays into the nose daily.   Yes [provider]  neomycin-polymyxin-hydrocortisone (  CORTISPORIN) 3.5-10000-1 OTIC suspension INSTILL 4 DROPS INTO THE AFFECTED EAR TWICE DAILY FOR 7 DAYS 02/07/18  Yes [provider]  prednisoLONE acetate (PRED FORTE) 1 % ophthalmic suspension Place 1 drop into the right eye 4 (four) times daily. 03/06/18  Yes Bernarda Caffey, MD  sildenafil (REVATIO) 20 MG tablet Take 20 mg by mouth 3 (three) times daily.    Yes [provider]  testosterone cypionate (DEPOTESTOSTERONE CYPIONATE) 200 MG/ML injection INJECT 1.5 MLS (300 MG TOTAL) INTO THE MUSCLE EVERY 28 (TWENTY-EIGHT) DAYS 01/12/18  Yes [provider]  testosterone cypionate (DEPOTESTOTERONE CYPIONATE) 100 MG/ML injection Inject 200 mg into the muscle every 14 (fourteen) days. For IM use only   Yes [provider]    Allergies as of 05/01/2018 - Review Complete 04/25/2018  Allergen Reaction Noted  . Biaxin [clarithromycin] Nausea Only 12/27/2016  . Codeine sulfate Nausea Only 12/27/2016    Family History  Problem Relation Age of Onset  . Cataracts Mother   . Amblyopia Neg Hx   . Blindness Neg Hx   . Glaucoma Neg Hx   . Macular degeneration Neg Hx   . Retinal detachment Neg Hx   . Strabismus Neg Hx   . Retinitis pigmentosa Neg Hx     Social History   Socioeconomic History  . Marital status: Married    Spouse name: Not on file  . Number of children: Not on file  . Years of education: Not on file  . Highest education level: Not on file  Occupational History  . Not on file  Social Needs  . Financial resource strain: Not hard at all  . Food insecurity:    Worry: Patient refused    Inability: Patient refused  . Transportation needs:    Medical: Patient refused    Non-medical: Patient refused  Tobacco Use  . Smoking status: Never Smoker  . Smokeless tobacco: Never Used  Substance and Sexual Activity  . Alcohol use: No  . Drug use: No  . Sexual activity: Yes  Lifestyle  . Physical activity:    Days per week: Patient refused    Minutes per session: Patient refused  . Stress: Not at all  Relationships  . Social connections:    Talks on phone: Patient refused    Gets together: Patient refused    Attends religious service: Patient refused    Active member of club or organization: Patient refused    Attends meetings of clubs or organizations: Patient refused    Relationship status: Patient  refused  . Intimate partner violence:    Fear of current or ex partner: Patient refused    Emotionally abused: Patient refused    Physically abused: Patient refused    Forced sexual activity: Patient refused  Other Topics Concern  . Not on file  Social History Narrative  . Not on file    Review of Systems: See HPI, otherwise negative ROS  Physical Exam: BP (!) 170/96   Pulse 86   Temp (!) 97.4 F (36.3 C) (Tympanic)   Resp 17   Ht 5\' 8"  (1.727 m)   Wt 88 kg   SpO2 98%   BMI 29.50 kg/m  General:   Alert,  pleasant and cooperative in NAD Head:  Normocephalic and atraumatic. Neck:  Supple; no masses or thyromegaly. Lungs:  Clear throughout to auscultation.    Heart:  Regular rate and rhythm. Abdomen:  Soft, nontender and nondistended. Normal bowel sounds, without guarding, and without rebound.  Neurologic:  Alert and  oriented x4;  grossly normal neurologically.  Impression/Plan: Sherie Don. is here for an colonoscopy to be performed for [facility] colon polyps last colonoscopy was 2//01/2013  Risks, benefits, limitations, and alternatives regarding  colonoscopy have been reviewed with the patient.  Questions have been answered.  All parties agreeable.   Gaylyn Cheers, MD  06/06/2018, 8:20 AM

## 2018-06-06 NOTE — Anesthesia Post-op Follow-up Note (Signed)
Anesthesia QCDR form completed.        

## 2018-06-06 NOTE — Anesthesia Preprocedure Evaluation (Signed)
Anesthesia Evaluation  Patient identified by MRN, date of birth, ID band Patient awake    Reviewed: Allergy & Precautions, H&P , NPO status , Patient's Chart, lab work & pertinent test results, reviewed documented beta blocker date and time   Airway Mallampati: II   Neck ROM: full    Dental  (+) Poor Dentition   Pulmonary neg shortness of breath, sleep apnea ,    Pulmonary exam normal        Cardiovascular Exercise Tolerance: Good hypertension, On Medications negative cardio ROS Normal cardiovascular exam Rhythm:regular Rate:Normal     Neuro/Psych negative neurological ROS  negative psych ROS   GI/Hepatic Neg liver ROS, GERD  Medicated,  Endo/Other  negative endocrine ROSdiabetes  Renal/GU negative Renal ROS  negative genitourinary   Musculoskeletal   Abdominal   Peds  Hematology negative hematology ROS (+)   Anesthesia Other Findings Past Medical History: No date: Barrett esophagus No date: Colon adenomas No date: Diabetes mellitus without complication (HCC)     Comment:  Type 2 No date: GERD (gastroesophageal reflux disease) No date: Hyperlipidemia No date: Hypertension No date: Hypotestosteronemia No date: Hypotestosteronemia No date: OSA (obstructive sleep apnea) No date: Reactive airway disease No date: Seasonal allergies Past Surgical History: No date: CATARACT EXTRACTION 07/01/2001: CATARACT EXTRACTION, BILATERAL 2009/2014: COLONOSCOPY; Right 2001/2005/2009/2012/2015: COLONOSCOPY WITH ESOPHAGOGASTRODUODENOSCOPY  (EGD) 12/28/2016: ESOPHAGOGASTRODUODENOSCOPY (EGD) WITH PROPOFOL; N/A     Comment:  Procedure: ESOPHAGOGASTRODUODENOSCOPY (EGD) WITH               PROPOFOL;  Surgeon: Manya Silvas, MD;  Location:               [facility] ENDOSCOPY;  Service: Endoscopy;  Laterality: N/A; 03/13/2000: FLEXIBLE SIGMOIDOSCOPY 03/13/2000: SEPTOPLASTY No date: YAG LASER APPLICATION BMI    Body Mass Index:   29.50 kg/m     Reproductive/Obstetrics negative OB ROS                             Anesthesia Physical Anesthesia Plan  ASA: III  Anesthesia Plan: General   Post-op Pain Management:    Induction:   PONV Risk Score and Plan:   Airway Management Planned:   Additional Equipment:   Intra-op Plan:   Post-operative Plan:   Informed Consent: I have reviewed the patients History and Physical, chart, labs and discussed the procedure including the risks, benefits and alternatives for the proposed anesthesia with the patient or authorized representative who has indicated his/her understanding and acceptance.   Dental Advisory Given  Plan Discussed with: CRNA  Anesthesia Plan Comments:         Anesthesia Quick Evaluation

## 2018-06-06 NOTE — Anesthesia Postprocedure Evaluation (Signed)
Anesthesia Post Note  Patient: Trevor Stafford.  Procedure(s) Performed: COLONOSCOPY WITH PROPOFOL (N/A )  Patient location during evaluation: PACU Anesthesia Type: General Level of consciousness: awake and alert Pain management: pain level controlled Vital Signs Assessment: post-procedure vital signs reviewed and stable Respiratory status: spontaneous breathing, nonlabored ventilation, respiratory function stable and patient connected to nasal cannula oxygen Cardiovascular status: blood pressure returned to baseline and stable Postop Assessment: no apparent nausea or vomiting Anesthetic complications: no     Last Vitals:  Vitals:   06/06/18 0851 06/06/18 0921  BP: 101/68 124/78  Pulse:    Resp:    Temp:    SpO2:      Last Pain:  Vitals:   06/06/18 0933  TempSrc:   PainSc: 0-No pain                 Molli Barrows

## 2018-06-07 ENCOUNTER — Encounter: Payer: Self-pay | Admitting: Unknown Physician Specialty

## 2018-06-19 NOTE — Progress Notes (Signed)
06/20/2018 11:46 AM   Trevor Stafford 06-20-1946 [id number]  Referring provider: Rusty Aus, MD [facility] [place], [place] [address]  Chief Complaint  Patient presents with  . Establish Care    elevated PSA    HPI: Trevor Stafford. is a 72 yo M who presents today for the evaluation and management of elevated PSA.    His PSA has been steadily rising over the past 2 years.  Most recent PSA 8.828 on 05/2018.  PSA trend below.  Pt has a SHx of colonoscopy in which Gastroenterology reported prostate concerning with a nodular appearance by Dr. Tiffany Kocher on 06/2018.  He reports doing well overall and reports losing his wife on 03/07/2018. Patient denies any gross hematuria, dysuria or suprapubic/flank pain. Pt reports nocturia once a night.   He has FHx history of prostate cancer and his father was in the 55s when he died. Treated for resumed prostatitis 2 months ago.   Pt denies any personal history of heart attacks, and strokes. Pt also takes aspirin everyday. .   Pt started testosterone injections 2 years ago and dosage increased 2 months ago.   He has been taking this medication for about 2 years.  He was having low energy level and decreased physical strength at the gym.  No weight loss or bone pain.  PSA trend: 8.28 05/2018 6.73 03/2018 4.68 09/2016 3.71 01/2016 3.75 01/2015 3.39 01/2014    PMH: Past Medical History:  Diagnosis Date  . Acid reflux   . Barrett esophagus   . Colon adenomas   . Diabetes mellitus without complication (HCC)    Type 2  . GERD (gastroesophageal reflux disease)   . Hyperlipidemia   . Hypertension   . Hypotestosteronemia   . Hypotestosteronemia   . OSA (obstructive sleep apnea)   . Reactive airway disease   . Seasonal allergies     Surgical History: Past Surgical History:  Procedure Laterality Date  . CATARACT EXTRACTION    . CATARACT EXTRACTION, BILATERAL  07/01/2001  .  COLONOSCOPY Right 2009/2014  . COLONOSCOPY WITH ESOPHAGOGASTRODUODENOSCOPY (EGD)  2001/2005/2009/2012/2015  . COLONOSCOPY WITH PROPOFOL N/A 06/06/2018   Procedure: COLONOSCOPY WITH PROPOFOL;  Surgeon: Manya Silvas, MD;  Location: [facility] ENDOSCOPY;  Service: Endoscopy;  Laterality: N/A;  . ESOPHAGOGASTRODUODENOSCOPY (EGD) WITH PROPOFOL N/A 12/28/2016   Procedure: ESOPHAGOGASTRODUODENOSCOPY (EGD) WITH PROPOFOL;  Surgeon: Manya Silvas, MD;  Location: [facility] ENDOSCOPY;  Service: Endoscopy;  Laterality: N/A;  . FLEXIBLE SIGMOIDOSCOPY  03/13/2000  . SEPTOPLASTY  03/13/2000  . YAG LASER APPLICATION      Home Medications:  Allergies as of 06/20/2018      Reactions   Biaxin [clarithromycin] Nausea Only   Codeine Sulfate Nausea Only      Medication List        Accurate as of 06/20/18 11:46 AM. Always use your most recent med list.          albuterol 108 (90 Base) MCG/ACT inhaler Commonly known as:  PROVENTIL HFA;VENTOLIN HFA Inhale into the lungs every 6 (six) hours as needed for wheezing or shortness of breath.   VENTOLIN HFA 108 (90 Base) MCG/ACT inhaler Generic drug:  albuterol INHALE 2 INHALATIONS INTO THE LUNGS EVERY 6 (SIX) HOURS AS NEEDED FOR WHEEZING.   aspirin-acetaminophen-caffeine 287-867-67 MG tablet Commonly known as:  EXCEDRIN MIGRAINE Take by mouth every 6 (six) hours as needed for headache or migraine (PRN).   BIOGAIA PROBIOTIC Liqd Take by  mouth daily at 8 pm.   brimonidine 0.2 % ophthalmic solution Commonly known as:  ALPHAGAN INSTILL 1 DROP INTO BOTH EYES TWICE A DAY   budesonide 180 MCG/ACT inhaler Commonly known as:  PULMICORT Inhale into the lungs 2 (two) times daily.   PULMICORT FLEXHALER 180 MCG/ACT inhaler Generic drug:  budesonide INHALE 2 INHALATIONS INTO THE LUNGS 2 TIMES DAILY   calcium-vitamin D 500-200 MG-UNIT tablet Commonly known as:  OSCAL WITH D Take 1 tablet by mouth.   esomeprazole 40 MG packet Commonly known as:  NEXIUM Take  40 mg by mouth daily before breakfast.   NEXIUM 40 MG capsule Generic drug:  esomeprazole TAKE 1 CAPSULE BY MOUTH ONCE A DAY   glimepiride 1 MG tablet Commonly known as:  AMARYL Take 1 mg by mouth daily with breakfast.   hydrocortisone 2.5 % rectal cream Commonly known as:  ANUSOL-HC Place 1 application rectally.   irbesartan 150 MG tablet Commonly known as:  AVAPRO Take 150 mg by mouth daily.   ketorolac 0.5 % ophthalmic solution Commonly known as:  ACULAR Place 1 drop into the right eye 4 (four) times daily.   loteprednol 0.5 % ophthalmic suspension Commonly known as:  LOTEMAX 4 (four) times daily.   magnesium oxide 400 MG tablet Commonly known as:  MAG-OX Take 400 mg by mouth daily.   metFORMIN 500 MG 24 hr tablet Commonly known as:  GLUCOPHAGE-XR Take 500 mg by mouth daily with breakfast.   mometasone 50 MCG/ACT nasal spray Commonly known as:  NASONEX Place 2 sprays into the nose daily.   neomycin-polymyxin-hydrocortisone 3.5-10000-1 OTIC suspension Commonly known as:  CORTISPORIN INSTILL 4 DROPS INTO THE AFFECTED EAR TWICE DAILY FOR 7 DAYS   prednisoLONE acetate 1 % ophthalmic suspension Commonly known as:  PRED FORTE Place 1 drop into the right eye 4 (four) times daily.   sildenafil 20 MG tablet Commonly known as:  REVATIO Take 20 mg by mouth 3 (three) times daily.   testosterone cypionate 100 MG/ML injection Commonly known as:  DEPOTESTOTERONE CYPIONATE Inject 200 mg into the muscle every 14 (fourteen) days. For IM use only   testosterone cypionate 200 MG/ML injection Commonly known as:  DEPOTESTOSTERONE CYPIONATE INJECT 1.5 MLS (300 MG TOTAL) INTO THE MUSCLE EVERY 28 (TWENTY-EIGHT) DAYS       Allergies:  Allergies  Allergen Reactions  . Biaxin [Clarithromycin] Nausea Only  . Codeine Sulfate Nausea Only    Family History: Family History  Problem Relation Age of Onset  . Cataracts Mother   . Prostate cancer Paternal Grandfather   .  Amblyopia Neg Hx   . Blindness Neg Hx   . Glaucoma Neg Hx   . Macular degeneration Neg Hx   . Retinal detachment Neg Hx   . Strabismus Neg Hx   . Retinitis pigmentosa Neg Hx     Social History:  reports that he has never smoked. He has never used smokeless tobacco. He reports that he does not drink alcohol or use drugs.  ROS: UROLOGY Frequent Urination?: No Hard to postpone urination?: No Burning/pain with urination?: No Get up at night to urinate?: Yes Leakage of urine?: No Urine stream starts and stops?: No Trouble starting stream?: No Do you have to strain to urinate?: No Blood in urine?: No Urinary tract infection?: No Sexually transmitted disease?: No Injury to kidneys or bladder?: No Painful intercourse?: No Weak stream?: No Erection problems?: No Penile pain?: No  Gastrointestinal Nausea?: No Vomiting?: No Indigestion/heartburn?: No Diarrhea?: No  Constipation?: No  Constitutional Fever: No Night sweats?: No Weight loss?: No Fatigue?: No  Skin Skin rash/lesions?: No Itching?: No  Eyes Blurred vision?: No Double vision?: No  Ears/Nose/Throat Sore throat?: No Sinus problems?: No  Hematologic/Lymphatic Swollen glands?: No Easy bruising?: Yes  Cardiovascular Leg swelling?: No Chest pain?: No  Respiratory Cough?: No Shortness of breath?: No  Endocrine Excessive thirst?: No  Musculoskeletal Back pain?: No Joint pain?: No  Neurological Dizziness?: No  Psychologic Depression?: No Anxiety?: No  Physical Exam: BP 135/75 (BP Location: Left Arm, Patient Position: Sitting)   Pulse 91   Ht 5\' 8"  (1.727 m)   Wt 194 lb 8 oz (88.2 kg)   BMI 29.57 kg/m   Constitutional:  Alert and oriented, No acute distress. HEENT: [place] AT, moist mucus membranes.  Trachea midline, no masses. Cardiovascular: No clubbing, cyanosis, or edema. Respiratory: Normal respiratory effort, no increased work of breathing. GI: Abdomen is soft, nontender, nondistended,  no abdominal masses GU: No CVA tenderness  Rectal: Slightly decreased sphinter tone and induration on the left lateral lobe and another nodule on the right lateral lobe. Nodule on right lateral apex and  induration on the left lateral ridge.  Skin: No rashes, bruises or suspicious lesions. Neurologic: Grossly intact, no focal deficits, moving all 4 extremities. Psychiatric: Normal mood and affect.  Laboratory Data: PSA as above  Urinalysis UA reviewed, negative  Pertinent Imaging: Results for orders placed or performed in visit on 06/20/18  Bladder Scan (Post Void Residual) in office  Result Value Ref Range   Scan Result 186ml      Assessment & Plan:    1. Elevated PSA We discussed prostate biopsy in detail including the procedure itself, the risks of blood in the urine, stool, and ejaculate, serious infection, and discomfort. He is willing to proceed with this as discussed. We reviewed the implications of an elevated PSA and the uncertainty surrounding it. In general, a man's PSA increases with age and is produced by both normal and cancerous prostate tissue. Differential for elevated PSA is BPH, prostate cancer, infection, recent intercourse/ejaculation, prostate infarction, recent urethroscopic manipulation (foley placement/cystoscopy) and prostatitis. Management of an elevated PSA can include observation or prostate biopsy and wediscussed this in detail. We discussed that indications for prostate biopsy are defined by age and race specific PSA cutoffs as well as a PSA velocity of 0.75/year.  Rectal exam and rising PSA highly concerning for prostate cancer.  Aspirin to be stopped 7 days prior to biopsy and to do enema 1 day prior to biopsy   Pt advised to eat and drink before prostate biopsy and to be scheduled ASAP    2. Prostate nodule Highly concern for prostate cancer. See above  3. Hypogonadism  Stop testosterone until future notification. See above   Return for  prostate biopsy asap .  [facility] [address] [place], [place] [address] [phone]  I, Lucas Mallow, am acting as a scribe for Dr. Hollice Espy,  I have reviewed the above documentation for accuracy and completeness, and I agree with the above.   Hollice Espy, MD

## 2018-06-20 ENCOUNTER — Ambulatory Visit: Payer: Medicare HMO | Admitting: Urology

## 2018-06-20 ENCOUNTER — Encounter

## 2018-06-20 ENCOUNTER — Encounter: Payer: Self-pay | Admitting: Urology

## 2018-06-20 VITALS — BP 135/75 | HR 91 | Ht 68.0 in | Wt 194.5 lb

## 2018-06-20 DIAGNOSIS — Z01812 Encounter for preprocedural laboratory examination: Secondary | ICD-10-CM | POA: Diagnosis not present

## 2018-06-20 LAB — URINALYSIS, COMPLETE
BILIRUBIN UA: NEGATIVE
Glucose, UA: NEGATIVE
Ketones, UA: NEGATIVE
Leukocytes, UA: NEGATIVE
NITRITE UA: NEGATIVE
PH UA: 6 (ref 5.0–7.5)
Protein, UA: NEGATIVE
RBC UA: NEGATIVE
SPEC GRAV UA: 1.01 (ref 1.005–1.030)
Urobilinogen, Ur: 0.2 mg/dL (ref 0.2–1.0)

## 2018-06-20 LAB — BLADDER SCAN AMB NON-IMAGING

## 2018-06-20 NOTE — Patient Instructions (Signed)

## 2018-06-26 ENCOUNTER — Ambulatory Visit (INDEPENDENT_AMBULATORY_CARE_PROVIDER_SITE_OTHER): Payer: Medicare HMO | Admitting: Urology

## 2018-06-26 ENCOUNTER — Encounter: Payer: Self-pay | Admitting: Urology

## 2018-06-26 ENCOUNTER — Other Ambulatory Visit: Payer: Self-pay | Admitting: Urology

## 2018-06-26 VITALS — BP 153/79 | HR 92 | Ht 68.0 in | Wt 192.0 lb

## 2018-06-26 DIAGNOSIS — R972 Elevated prostate specific antigen [PSA]: Secondary | ICD-10-CM

## 2018-06-26 MED ORDER — LEVOFLOXACIN 500 MG PO TABS
500.0000 mg | ORAL_TABLET | Freq: Once | ORAL | Status: AC
  Administered 2018-06-26: 500 mg via ORAL

## 2018-06-26 MED ORDER — GENTAMICIN SULFATE 40 MG/ML IJ SOLN
80.0000 mg | Freq: Once | INTRAMUSCULAR | Status: AC
  Administered 2018-06-26: 80 mg via INTRAMUSCULAR

## 2018-06-26 NOTE — Progress Notes (Signed)
   06/26/18  CC:  Chief Complaint  Patient presents with  . Prostate Biopsy    HPI: 72 year old male with abnormal rectal exam and rising PSA highly concerning for prostate cancer.  He presents today for prostate biopsy.  He has held his aspirin since his initial evaluation.  NED. A&Ox3.   No respiratory distress   Abd soft, NT, ND Normal sphincter tone  Prostate Biopsy Procedure   Informed consent was obtained after discussing risks/benefits of the procedure.  A time out was performed to ensure correct patient identity.  Pre-Procedure: - Last PSA Level: No results found for: PSA - Gentamicin given prophylactically - Levaquin 500 mg administered PO -Transrectal Ultrasound performed revealing a 43 gm prostate -Small median lobe noted  Procedure: - Prostate block performed using 10 cc 1% lidocaine and biopsies taken from sextant areas, a total of 12 under ultrasound guidance.  Post-Procedure: - Patient tolerated the procedure well - He was counseled to seek immediate medical attention if experiences any severe pain, significant bleeding, or fevers - Return in one to two weeks to discuss biopsy results  Hollice Espy, MD   Of note, the patient will be out of the country on a cruise.  He will have access to the Internet.  He requested his results be sent to him via my chart which is not my typical mode of communication for malignancy.  He understands this and would like to have the results via my chart regardless.

## 2018-07-01 ENCOUNTER — Other Ambulatory Visit (INDEPENDENT_AMBULATORY_CARE_PROVIDER_SITE_OTHER): Payer: Self-pay | Admitting: Ophthalmology

## 2018-07-04 ENCOUNTER — Telehealth: Payer: Self-pay | Admitting: Urology

## 2018-07-04 ENCOUNTER — Encounter: Payer: Self-pay | Admitting: Urology

## 2018-07-04 ENCOUNTER — Other Ambulatory Visit: Payer: Self-pay | Admitting: Urology

## 2018-07-04 DIAGNOSIS — C61 Malignant neoplasm of prostate: Secondary | ICD-10-CM

## 2018-07-04 LAB — PATHOLOGY REPORT

## 2018-07-04 NOTE — Telephone Encounter (Signed)
This patient has high risk prostate cancer.  He will need staging with CT abdomen pelvis as well as bone scan.  I sent him a MyChart message indicating this as per his request as he will be out of town on a cruise.  Orders placed today, ideally this could be completed prior to his follow-up later this month.  Please help me arrange.  Hollice Espy, MD

## 2018-07-09 ENCOUNTER — Telehealth: Payer: Self-pay | Admitting: Urology

## 2018-07-09 NOTE — Telephone Encounter (Signed)
Returned patient's son's call.    Hollice Espy, MD

## 2018-07-09 NOTE — Telephone Encounter (Signed)
FYI   Pt son Trevor Stafford called office to inform the office the sudden passing of his father.  Chip went on to say that he knew his father just had a biopsy done and was very anxious about the results and stated they were supposed to be on a cruise right now, but father (the pt) passed away suddenly last week. Chip asks for the results of his fathers biopsy per his (Chip's PCP recommendation of family hx of prostate cancer and to follow up himself) Spoke to [place] about this, Since the results were sent to the pt via [place] is on the pt's DPR it was okay to read Chip the message about his fathers results, so that he could follow up with his PCP. Chip states he has access to his father's mychart but hadn't thought about checking it and would rather me read him the results because he isn't near a computer at the moment and didn't want to wait if he didn't have to, then he expressed lots of gratitude and thanks that time was taken to read it to him, instead of him having to read it himself. States this is all very hard to take in at the moment and apologized for his loss for words. Advised Chip to follow PCP recommendation of checking PSA and family hx of prostate cancer.  Chip expresses his thanks and gratitude of the [facility] staff for being so kind and taking care of both of his parents. I expressed our sincere condolences and to call if he should need anything further.   FYI

## 2018-07-09 NOTE — Telephone Encounter (Signed)
Chip calls office back stating that he now can't access mychart due to the the chart being disabled, maybe because the pt has been flagged "deceased" in the system.  Chip is having a hard time absorbing all of this and would like to talk to you, he has questions and concerns about his own well being now.  Since he can no longer access his fathers MyChart to get the results for his PCP he is reaching out to you for advice. Please advise Chip at [phone].

## 2018-07-17 ENCOUNTER — Ambulatory Visit: Payer: Medicare HMO | Admitting: Urology

## 2018-08-01 DEATH — deceased
